# Patient Record
Sex: Female | Born: 1935 | Race: Black or African American | Hispanic: No | Smoking: Former smoker
Health system: Southern US, Community
[De-identification: ages and names within clinical notes are randomized; demographics above are authoritative.]

## PROBLEM LIST (undated history)

## (undated) DIAGNOSIS — N189 Chronic kidney disease, unspecified: Secondary | ICD-10-CM

## (undated) DIAGNOSIS — T6701XA Heatstroke and sunstroke, initial encounter: Secondary | ICD-10-CM

## (undated) DIAGNOSIS — I10 Essential (primary) hypertension: Secondary | ICD-10-CM

## (undated) DIAGNOSIS — F0390 Unspecified dementia without behavioral disturbance: Secondary | ICD-10-CM

## (undated) HISTORY — DX: Heatstroke and sunstroke, initial encounter: T67.01XA

## (undated) HISTORY — DX: Unspecified dementia, unspecified severity, without behavioral disturbance, psychotic disturbance, mood disturbance, and anxiety: F03.90

## (undated) HISTORY — PX: COLONOSCOPY: SHX174

---

## 1962-05-12 HISTORY — PX: ABDOMINAL HYSTERECTOMY: SHX81

## 1999-06-27 ENCOUNTER — Emergency Department (HOSPITAL_COMMUNITY): Admission: EM | Admit: 1999-06-27 | Discharge: 1999-06-27 | Payer: Self-pay | Admitting: Emergency Medicine

## 1999-07-03 ENCOUNTER — Encounter: Admission: RE | Admit: 1999-07-03 | Discharge: 1999-07-03 | Payer: Self-pay | Admitting: Internal Medicine

## 1999-07-10 ENCOUNTER — Ambulatory Visit (HOSPITAL_COMMUNITY): Admission: RE | Admit: 1999-07-10 | Discharge: 1999-07-10 | Payer: Self-pay | Admitting: *Deleted

## 1999-09-24 ENCOUNTER — Encounter: Admission: RE | Admit: 1999-09-24 | Discharge: 1999-09-24 | Payer: Self-pay | Admitting: Hematology and Oncology

## 1999-09-25 ENCOUNTER — Encounter: Payer: Self-pay | Admitting: *Deleted

## 1999-09-25 ENCOUNTER — Ambulatory Visit (HOSPITAL_COMMUNITY): Admission: RE | Admit: 1999-09-25 | Discharge: 1999-09-25 | Payer: Self-pay | Admitting: *Deleted

## 1999-10-08 ENCOUNTER — Encounter: Admission: RE | Admit: 1999-10-08 | Discharge: 1999-10-08 | Payer: Self-pay | Admitting: Internal Medicine

## 2000-02-03 ENCOUNTER — Encounter: Admission: RE | Admit: 2000-02-03 | Discharge: 2000-02-03 | Payer: Self-pay | Admitting: Internal Medicine

## 2000-04-13 ENCOUNTER — Encounter: Admission: RE | Admit: 2000-04-13 | Discharge: 2000-04-13 | Payer: Self-pay | Admitting: Internal Medicine

## 2000-04-27 ENCOUNTER — Encounter: Admission: RE | Admit: 2000-04-27 | Discharge: 2000-04-27 | Payer: Self-pay | Admitting: Internal Medicine

## 2000-05-06 ENCOUNTER — Encounter: Admission: RE | Admit: 2000-05-06 | Discharge: 2000-05-06 | Payer: Self-pay | Admitting: Internal Medicine

## 2000-05-25 ENCOUNTER — Encounter: Admission: RE | Admit: 2000-05-25 | Discharge: 2000-05-25 | Payer: Self-pay | Admitting: Internal Medicine

## 2000-07-23 ENCOUNTER — Encounter: Admission: RE | Admit: 2000-07-23 | Discharge: 2000-07-23 | Payer: Self-pay | Admitting: Hematology and Oncology

## 2000-07-30 ENCOUNTER — Ambulatory Visit (HOSPITAL_COMMUNITY): Admission: RE | Admit: 2000-07-30 | Discharge: 2000-07-30 | Payer: Self-pay

## 2000-09-21 ENCOUNTER — Encounter: Payer: Self-pay | Admitting: Internal Medicine

## 2000-09-21 ENCOUNTER — Encounter: Admission: RE | Admit: 2000-09-21 | Discharge: 2000-09-21 | Payer: Self-pay | Admitting: Internal Medicine

## 2000-09-21 ENCOUNTER — Ambulatory Visit (HOSPITAL_COMMUNITY): Admission: RE | Admit: 2000-09-21 | Discharge: 2000-09-21 | Payer: Self-pay | Admitting: Internal Medicine

## 2000-10-14 ENCOUNTER — Ambulatory Visit (HOSPITAL_COMMUNITY): Admission: RE | Admit: 2000-10-14 | Discharge: 2000-10-14 | Payer: Self-pay | Admitting: *Deleted

## 2000-11-26 ENCOUNTER — Ambulatory Visit (HOSPITAL_COMMUNITY): Admission: RE | Admit: 2000-11-26 | Discharge: 2000-11-26 | Payer: Self-pay | Admitting: Internal Medicine

## 2000-11-26 ENCOUNTER — Encounter: Admission: RE | Admit: 2000-11-26 | Discharge: 2000-11-26 | Payer: Self-pay | Admitting: Obstetrics

## 2000-11-27 ENCOUNTER — Encounter: Admission: RE | Admit: 2000-11-27 | Discharge: 2000-11-27 | Payer: Self-pay | Admitting: Internal Medicine

## 2001-01-21 ENCOUNTER — Encounter: Admission: RE | Admit: 2001-01-21 | Discharge: 2001-01-21 | Payer: Self-pay | Admitting: Internal Medicine

## 2001-01-27 ENCOUNTER — Encounter (INDEPENDENT_AMBULATORY_CARE_PROVIDER_SITE_OTHER): Payer: Self-pay | Admitting: Specialist

## 2001-01-27 ENCOUNTER — Ambulatory Visit (HOSPITAL_COMMUNITY): Admission: RE | Admit: 2001-01-27 | Discharge: 2001-01-27 | Payer: Self-pay | Admitting: *Deleted

## 2001-03-03 ENCOUNTER — Encounter: Admission: RE | Admit: 2001-03-03 | Discharge: 2001-03-03 | Payer: Self-pay

## 2001-04-21 ENCOUNTER — Encounter: Admission: RE | Admit: 2001-04-21 | Discharge: 2001-04-21 | Payer: Self-pay | Admitting: Internal Medicine

## 2001-07-12 ENCOUNTER — Encounter: Admission: RE | Admit: 2001-07-12 | Discharge: 2001-07-12 | Payer: Self-pay | Admitting: Internal Medicine

## 2001-07-15 ENCOUNTER — Ambulatory Visit (HOSPITAL_COMMUNITY): Admission: RE | Admit: 2001-07-15 | Discharge: 2001-07-15 | Payer: Self-pay | Admitting: *Deleted

## 2001-10-20 ENCOUNTER — Encounter: Admission: RE | Admit: 2001-10-20 | Discharge: 2001-10-20 | Payer: Self-pay | Admitting: Internal Medicine

## 2002-01-31 ENCOUNTER — Encounter: Admission: RE | Admit: 2002-01-31 | Discharge: 2002-01-31 | Payer: Self-pay | Admitting: Internal Medicine

## 2002-02-10 ENCOUNTER — Encounter: Admission: RE | Admit: 2002-02-10 | Discharge: 2002-02-10 | Payer: Self-pay | Admitting: Internal Medicine

## 2002-02-16 ENCOUNTER — Ambulatory Visit (HOSPITAL_COMMUNITY): Admission: RE | Admit: 2002-02-16 | Discharge: 2002-02-16 | Payer: Self-pay | Admitting: Internal Medicine

## 2002-03-09 ENCOUNTER — Encounter: Admission: RE | Admit: 2002-03-09 | Discharge: 2002-03-09 | Payer: Self-pay | Admitting: Internal Medicine

## 2002-03-24 ENCOUNTER — Encounter: Admission: RE | Admit: 2002-03-24 | Discharge: 2002-03-24 | Payer: Self-pay | Admitting: Internal Medicine

## 2002-06-13 ENCOUNTER — Encounter: Admission: RE | Admit: 2002-06-13 | Discharge: 2002-06-13 | Payer: Self-pay | Admitting: Internal Medicine

## 2002-07-18 ENCOUNTER — Encounter (INDEPENDENT_AMBULATORY_CARE_PROVIDER_SITE_OTHER): Payer: Self-pay | Admitting: Internal Medicine

## 2002-07-18 ENCOUNTER — Encounter: Admission: RE | Admit: 2002-07-18 | Discharge: 2002-07-18 | Payer: Self-pay | Admitting: Internal Medicine

## 2002-07-27 ENCOUNTER — Encounter: Admission: RE | Admit: 2002-07-27 | Discharge: 2002-07-27 | Payer: Self-pay | Admitting: Internal Medicine

## 2002-10-17 ENCOUNTER — Encounter: Admission: RE | Admit: 2002-10-17 | Discharge: 2002-10-17 | Payer: Self-pay | Admitting: Internal Medicine

## 2002-11-16 ENCOUNTER — Encounter: Payer: Self-pay | Admitting: Emergency Medicine

## 2002-11-16 ENCOUNTER — Emergency Department (HOSPITAL_COMMUNITY): Admission: EM | Admit: 2002-11-16 | Discharge: 2002-11-17 | Payer: Self-pay | Admitting: Emergency Medicine

## 2003-01-23 ENCOUNTER — Encounter: Admission: RE | Admit: 2003-01-23 | Discharge: 2003-01-23 | Payer: Self-pay | Admitting: Internal Medicine

## 2003-04-28 ENCOUNTER — Encounter: Admission: RE | Admit: 2003-04-28 | Discharge: 2003-04-28 | Payer: Self-pay | Admitting: Internal Medicine

## 2003-05-19 ENCOUNTER — Encounter: Admission: RE | Admit: 2003-05-19 | Discharge: 2003-05-19 | Payer: Self-pay | Admitting: Internal Medicine

## 2003-12-29 ENCOUNTER — Encounter: Admission: RE | Admit: 2003-12-29 | Discharge: 2003-12-29 | Payer: Self-pay | Admitting: Internal Medicine

## 2004-01-08 ENCOUNTER — Encounter: Admission: RE | Admit: 2004-01-08 | Discharge: 2004-01-08 | Payer: Self-pay | Admitting: Internal Medicine

## 2004-06-20 ENCOUNTER — Ambulatory Visit: Payer: Self-pay | Admitting: Internal Medicine

## 2004-08-29 ENCOUNTER — Ambulatory Visit: Payer: Self-pay | Admitting: Internal Medicine

## 2004-09-04 ENCOUNTER — Ambulatory Visit (HOSPITAL_COMMUNITY)
Admission: RE | Admit: 2004-09-04 | Discharge: 2004-09-04 | Payer: Self-pay | Admitting: Physical Medicine and Rehabilitation

## 2004-09-04 ENCOUNTER — Ambulatory Visit: Payer: Self-pay | Admitting: Internal Medicine

## 2004-09-05 ENCOUNTER — Ambulatory Visit (HOSPITAL_COMMUNITY): Admission: RE | Admit: 2004-09-05 | Discharge: 2004-09-05 | Payer: Self-pay | Admitting: Internal Medicine

## 2004-09-11 ENCOUNTER — Ambulatory Visit: Payer: Self-pay | Admitting: Internal Medicine

## 2004-09-27 ENCOUNTER — Ambulatory Visit: Payer: Self-pay | Admitting: Internal Medicine

## 2004-11-25 ENCOUNTER — Ambulatory Visit: Payer: Self-pay | Admitting: Internal Medicine

## 2004-12-02 ENCOUNTER — Ambulatory Visit: Payer: Self-pay | Admitting: Internal Medicine

## 2004-12-23 ENCOUNTER — Ambulatory Visit: Payer: Self-pay | Admitting: Internal Medicine

## 2005-01-06 ENCOUNTER — Ambulatory Visit: Payer: Self-pay | Admitting: Internal Medicine

## 2005-02-18 ENCOUNTER — Ambulatory Visit: Payer: Self-pay | Admitting: Internal Medicine

## 2005-02-21 ENCOUNTER — Ambulatory Visit: Payer: Self-pay | Admitting: Internal Medicine

## 2005-05-27 ENCOUNTER — Ambulatory Visit (HOSPITAL_COMMUNITY): Admission: RE | Admit: 2005-05-27 | Discharge: 2005-05-27 | Payer: Self-pay | Admitting: Internal Medicine

## 2005-05-27 ENCOUNTER — Ambulatory Visit: Payer: Self-pay | Admitting: Internal Medicine

## 2005-06-13 ENCOUNTER — Ambulatory Visit: Payer: Self-pay | Admitting: Hospitalist

## 2006-02-27 ENCOUNTER — Encounter (INDEPENDENT_AMBULATORY_CARE_PROVIDER_SITE_OTHER): Payer: Self-pay | Admitting: Internal Medicine

## 2006-02-27 DIAGNOSIS — I868 Varicose veins of other specified sites: Secondary | ICD-10-CM | POA: Insufficient documentation

## 2006-02-27 DIAGNOSIS — Z87898 Personal history of other specified conditions: Secondary | ICD-10-CM | POA: Insufficient documentation

## 2006-02-27 DIAGNOSIS — K219 Gastro-esophageal reflux disease without esophagitis: Secondary | ICD-10-CM | POA: Insufficient documentation

## 2006-02-27 DIAGNOSIS — I10 Essential (primary) hypertension: Secondary | ICD-10-CM | POA: Insufficient documentation

## 2006-02-27 DIAGNOSIS — M1990 Unspecified osteoarthritis, unspecified site: Secondary | ICD-10-CM | POA: Insufficient documentation

## 2006-02-27 DIAGNOSIS — M899 Disorder of bone, unspecified: Secondary | ICD-10-CM | POA: Insufficient documentation

## 2006-02-27 DIAGNOSIS — F411 Generalized anxiety disorder: Secondary | ICD-10-CM | POA: Insufficient documentation

## 2006-02-27 DIAGNOSIS — M949 Disorder of cartilage, unspecified: Secondary | ICD-10-CM

## 2006-02-27 DIAGNOSIS — Z87442 Personal history of urinary calculi: Secondary | ICD-10-CM | POA: Insufficient documentation

## 2006-02-27 DIAGNOSIS — O00109 Unspecified tubal pregnancy without intrauterine pregnancy: Secondary | ICD-10-CM | POA: Insufficient documentation

## 2010-09-27 NOTE — Consult Note (Signed)
NAME:  Robin Meyers, Robin Meyers                        ACCOUNT NO.:  [id number]   MEDICAL RECORD NO.:  [id number]                   PATIENT TYPE:  EMS   LOCATION:  ED                                   FACILITY:  [facility]   PHYSICIAN:  Katy Fitch. Naaman Plummer., M.D.          DATE OF BIRTH:  1928/07/20   DATE OF CONSULTATION:  DATE OF DISCHARGE:                                   CONSULTATION   CHIEF COMPLAINT:  Injury right long and ring fingers due to lawn mower  trauma.   HISTORY:  Robin Meyers is a 77 year old right hand dominant woman who was  brought by her family to the [facility] Emergency Room by private  vehicle following an unfortunate lawn mower injury at home.  While  attempting to clear some material from her lawn mower outlet she  accidentally sustained a lawn mower bite type injury to the right dominant  long and ring fingertips.  She had bleeding, untidy wounds.  She was  examined in the emergency room by Dr. Johny Blamer and noted to have signs of a  possible open fracture of her distal phalanges of the long and ring fingers.  For pain management digital blocks were placed with Marcaine, followed by  routine evaluation and management in the emergency room.   X-rays revealed signs of significant soft tissue trauma to the pulps of the  long and ring fingers as well as open fracture of the distal tufts.   A hand surgery consult was requested.  Due to the need to attend to an  acutely ill individual with an abscess a cognitive consultation with the  assistance of the emergency room physician's assistant was accomplished at  this time.   PAST MEDICAL HISTORY:  1. Robin Meyers's past medical history revealed that she has a history of     possible PENICILLIN allergy.  2. She could not recall her last tetanus immunization.  Her past medical problems include:  1. Hypertension.  2. Anxiety disorder.  3. Elevated cholesterol.  4. She denied a history of diabetes, thyroid disease,  gout or rheumatoid     arthritis.   SOCIAL HISTORY:  Reveals that she is a nonsmoker, does not use alcohol,  denies drug use.   BRIEF REVIEW OF SYSTEMS:  Noncontributory.   PHYSICAL EXAMINATION:  GENERAL:  Revealed an acutely traumatized 77 year old  woman.  She was an alert and oriented  77 year old woman.  VITAL SIGNS:  Revealed a temperature of 98.1 oral, blood pressure 151/82  sitting, heart rate 89, respirations 22.  MUSCULOSKELETAL:  Examination of her right upper extremity revealed untidy  evulsion lacerations of the distal pulps of her right long and ring fingers  with loss of both fat and the normal skin coverage of the distal phalangeal  tuft.  She had stellate lacerations with small skin islands present.  She  had hematoma covering her distal phalangeal fractures.  She did not  have any  significant injury to her nailfold nor did she have injury to her  interphalangeal joints.  Her neurovascular exam was difficult to ascertain  due to her prior digital blocks however, there was no reason to suspect that  she had significant neurologic injury other than the soft tissue loss  distally.   Her x-rays were reviewed and documented her soft tissue injuries and open  fracture predicament.   ASSESSMENT:  She had trauma to her right long and ring fingertips primarily  to the soft tissues of the pulp with open fractures of the distal phalanges.   Emergency room staff was instructed to perform saline irrigation, followed  by Betadine scrub of her fingertips, followed by dressing the fingertips  with a nonadherent dressing with Silvadene, gauze and Coban.   PLAN:  Have her return to the [facility] for followup in 3-5  days.  She was given our phone number and a map to the office and will  return sooner p.r.n. problems.  She will be covered by broad-spectrum  antibiotics such as Keflex.                                                Katy Fitch Naaman Plummer., M.D.     RVS/MEDQ  D:  11/17/2002  T:  11/17/2002  Job:  [id number]

## 2013-12-01 ENCOUNTER — Emergency Department (HOSPITAL_COMMUNITY): Payer: Medicare Other

## 2013-12-01 ENCOUNTER — Inpatient Hospital Stay (HOSPITAL_COMMUNITY)
Admission: EM | Admit: 2013-12-01 | Discharge: 2013-12-05 | DRG: 682 | Disposition: A | Discharge status: Skilled Nursing Facility | Payer: Medicare Other | Attending: Internal Medicine | Admitting: Internal Medicine

## 2013-12-01 ENCOUNTER — Encounter (HOSPITAL_COMMUNITY): Payer: Self-pay | Admitting: Radiology

## 2013-12-01 DIAGNOSIS — M899 Disorder of bone, unspecified: Secondary | ICD-10-CM

## 2013-12-01 DIAGNOSIS — G9340 Encephalopathy, unspecified: Secondary | ICD-10-CM

## 2013-12-01 DIAGNOSIS — E861 Hypovolemia: Secondary | ICD-10-CM | POA: Diagnosis present

## 2013-12-01 DIAGNOSIS — N183 Chronic kidney disease, stage 3 unspecified: Secondary | ICD-10-CM | POA: Diagnosis present

## 2013-12-01 DIAGNOSIS — N170 Acute kidney failure with tubular necrosis: Principal | ICD-10-CM | POA: Diagnosis present

## 2013-12-01 DIAGNOSIS — F0390 Unspecified dementia without behavioral disturbance: Secondary | ICD-10-CM | POA: Diagnosis present

## 2013-12-01 DIAGNOSIS — E870 Hyperosmolality and hypernatremia: Secondary | ICD-10-CM | POA: Diagnosis present

## 2013-12-01 DIAGNOSIS — N189 Chronic kidney disease, unspecified: Secondary | ICD-10-CM

## 2013-12-01 DIAGNOSIS — M1990 Unspecified osteoarthritis, unspecified site: Secondary | ICD-10-CM

## 2013-12-01 DIAGNOSIS — E785 Hyperlipidemia, unspecified: Secondary | ICD-10-CM | POA: Diagnosis present

## 2013-12-01 DIAGNOSIS — O00109 Unspecified tubal pregnancy without intrauterine pregnancy: Secondary | ICD-10-CM

## 2013-12-01 DIAGNOSIS — R509 Fever, unspecified: Secondary | ICD-10-CM | POA: Diagnosis not present

## 2013-12-01 DIAGNOSIS — Z87898 Personal history of other specified conditions: Secondary | ICD-10-CM

## 2013-12-01 DIAGNOSIS — Z87442 Personal history of urinary calculi: Secondary | ICD-10-CM

## 2013-12-01 DIAGNOSIS — R410 Disorientation, unspecified: Secondary | ICD-10-CM

## 2013-12-01 DIAGNOSIS — R319 Hematuria, unspecified: Secondary | ICD-10-CM | POA: Diagnosis not present

## 2013-12-01 DIAGNOSIS — E860 Dehydration: Secondary | ICD-10-CM

## 2013-12-01 DIAGNOSIS — Z87891 Personal history of nicotine dependence: Secondary | ICD-10-CM

## 2013-12-01 DIAGNOSIS — F411 Generalized anxiety disorder: Secondary | ICD-10-CM

## 2013-12-01 DIAGNOSIS — I129 Hypertensive chronic kidney disease with stage 1 through stage 4 chronic kidney disease, or unspecified chronic kidney disease: Secondary | ICD-10-CM | POA: Diagnosis present

## 2013-12-01 DIAGNOSIS — N179 Acute kidney failure, unspecified: Secondary | ICD-10-CM | POA: Diagnosis present

## 2013-12-01 DIAGNOSIS — Z7982 Long term (current) use of aspirin: Secondary | ICD-10-CM

## 2013-12-01 DIAGNOSIS — I10 Essential (primary) hypertension: Secondary | ICD-10-CM | POA: Diagnosis present

## 2013-12-01 DIAGNOSIS — M949 Disorder of cartilage, unspecified: Secondary | ICD-10-CM

## 2013-12-01 DIAGNOSIS — I868 Varicose veins of other specified sites: Secondary | ICD-10-CM

## 2013-12-01 HISTORY — DX: Chronic kidney disease, unspecified: N18.9

## 2013-12-01 HISTORY — DX: Essential (primary) hypertension: I10

## 2013-12-01 LAB — URINALYSIS, ROUTINE W REFLEX MICROSCOPIC
GLUCOSE, UA: NEGATIVE mg/dL
KETONES UR: 15 mg/dL — AB
Nitrite: NEGATIVE
PROTEIN: 100 mg/dL — AB
Specific Gravity, Urine: 1.031 — ABNORMAL HIGH (ref 1.005–1.030)
Urobilinogen, UA: 0.2 mg/dL (ref 0.0–1.0)
pH: 5 (ref 5.0–8.0)

## 2013-12-01 LAB — CK: Total CK: 271 U/L — ABNORMAL HIGH (ref 7–177)

## 2013-12-01 LAB — CBC WITH DIFFERENTIAL/PLATELET
BASOS ABS: 0 10*3/uL (ref 0.0–0.1)
BASOS PCT: 0 % (ref 0–1)
Eosinophils Absolute: 0 10*3/uL (ref 0.0–0.7)
Eosinophils Relative: 0 % (ref 0–5)
HCT: 46.5 % — ABNORMAL HIGH (ref 36.0–46.0)
HEMOGLOBIN: 15 g/dL (ref 12.0–15.0)
Lymphocytes Relative: 22 % (ref 12–46)
Lymphs Abs: 1.9 10*3/uL (ref 0.7–4.0)
MCH: 28.9 pg (ref 26.0–34.0)
MCHC: 32.3 g/dL (ref 30.0–36.0)
MCV: 89.6 fL (ref 78.0–100.0)
MONOS PCT: 4 % (ref 3–12)
Monocytes Absolute: 0.4 10*3/uL (ref 0.1–1.0)
NEUTROS ABS: 6.6 10*3/uL (ref 1.7–7.7)
Neutrophils Relative %: 74 % (ref 43–77)
Platelets: 219 10*3/uL (ref 150–400)
RBC: 5.19 MIL/uL — ABNORMAL HIGH (ref 3.87–5.11)
RDW: 16 % — ABNORMAL HIGH (ref 11.5–15.5)
WBC: 8.9 10*3/uL (ref 4.0–10.5)

## 2013-12-01 LAB — COMPREHENSIVE METABOLIC PANEL
ALBUMIN: 3.7 g/dL (ref 3.5–5.2)
ALK PHOS: 53 U/L (ref 39–117)
ALT: 21 U/L (ref 0–35)
AST: 41 U/L — AB (ref 0–37)
Anion gap: 16 — ABNORMAL HIGH (ref 5–15)
BILIRUBIN TOTAL: 0.7 mg/dL (ref 0.3–1.2)
BUN: 23 mg/dL (ref 6–23)
CHLORIDE: 109 meq/L (ref 96–112)
CO2: 22 mEq/L (ref 19–32)
Calcium: 9.2 mg/dL (ref 8.4–10.5)
Creatinine, Ser: 1.44 mg/dL — ABNORMAL HIGH (ref 0.50–1.10)
GFR calc Af Amer: 38 mL/min — ABNORMAL LOW (ref >90)
GFR calc non Af Amer: 33 mL/min — ABNORMAL LOW (ref >90)
Glucose, Bld: 96 mg/dL (ref 70–99)
Potassium: 3.7 mEq/L (ref 3.7–5.3)
Sodium: 147 mEq/L (ref 137–147)
Total Protein: 7.8 g/dL (ref 6.0–8.3)

## 2013-12-01 LAB — I-STAT CG4 LACTIC ACID, ED: LACTIC ACID, VENOUS: 1.41 mmol/L (ref 0.5–2.2)

## 2013-12-01 LAB — URINE MICROSCOPIC-ADD ON

## 2013-12-01 LAB — I-STAT TROPONIN, ED: Troponin i, poc: 0.06 ng/mL (ref 0.00–0.08)

## 2013-12-01 LAB — PRO B NATRIURETIC PEPTIDE: PRO B NATRI PEPTIDE: 1121 pg/mL — AB (ref 0–450)

## 2013-12-01 MED ORDER — SODIUM CHLORIDE 0.9 % IJ SOLN
3.0000 mL | Freq: Two times a day (BID) | INTRAMUSCULAR | Status: DC
  Administered 2013-12-02 – 2013-12-05 (×4): 3 mL via INTRAVENOUS

## 2013-12-01 MED ORDER — SODIUM CHLORIDE 0.9 % IV SOLN
INTRAVENOUS | Status: DC
  Administered 2013-12-01 – 2013-12-02 (×3): via INTRAVENOUS

## 2013-12-01 MED ORDER — SODIUM CHLORIDE 0.9 % IV BOLUS (SEPSIS)
250.0000 mL | Freq: Once | INTRAVENOUS | Status: AC
  Administered 2013-12-01: 250 mL via INTRAVENOUS

## 2013-12-01 MED ORDER — ACETAMINOPHEN 650 MG RE SUPP
650.0000 mg | Freq: Once | RECTAL | Status: AC
Start: 2013-12-01 — End: 2013-12-01
  Administered 2013-12-01: 650 mg via RECTAL
  Filled 2013-12-01: qty 1

## 2013-12-01 MED ORDER — SODIUM CHLORIDE 0.9 % IV SOLN
INTRAVENOUS | Status: AC
  Administered 2013-12-01: via INTRAVENOUS

## 2013-12-01 MED ORDER — ASPIRIN 325 MG PO TABS
325.0000 mg | ORAL_TABLET | Freq: Every day | ORAL | Status: DC | PRN
  Filled 2013-12-01: qty 1

## 2013-12-01 MED ORDER — ADULT MULTIVITAMIN W/MINERALS CH
1.0000 | ORAL_TABLET | Freq: Every day | ORAL | Status: DC
  Administered 2013-12-04: 1 via ORAL
  Filled 2013-12-01 (×4): qty 1

## 2013-12-01 MED ORDER — HEPARIN SODIUM (PORCINE) 5000 UNIT/ML IJ SOLN
5000.0000 [IU] | Freq: Three times a day (TID) | INTRAMUSCULAR | Status: DC
  Administered 2013-12-01 – 2013-12-05 (×10): 5000 [IU] via SUBCUTANEOUS
  Filled 2013-12-01 (×13): qty 1

## 2013-12-01 MED ORDER — ACETAMINOPHEN 325 MG PO TABS: 325.0000 mg | ORAL_TABLET | Freq: Four times a day (QID) | ORAL | Status: DC | PRN

## 2013-12-01 NOTE — H&P (Signed)
[facility] History and Physical  Robin Meyers YNW:[id number] DOB: 1928/11/24 DOA: 12/01/2013  Referring physician: EDP PCP: No PCP Per Patient   Chief Complaint: Hyperthermia   HPI: Robin Meyers is a 80 y.o. female who was not heard from by family for a few days and they couldn't reach her.  They called 911 because they couldn't get into her house when they went to check on her today.  Upon arrival of EMS / Fire, they found patient on couch, incontinent of urine and very warm to touch.  Temp on scene 103 (103.9 in ED).  Neighbor states that last time they saw patient was last night.  Her house was noted to be extremely hot and her [facility] was not on (normally is).  After arrival to the ED, rehydration with IVF, she has improved dramatically and temperature is down to 99.  Her initial confusion and combativeness has improved somewhat and now she is only slightly confused.  She is now able to tell me, that her [facility] window unit, has not been working well (or much at all) recently, and there was even a discussion with a family member about replacing it in the past couple of days.  Review of Systems: Systems reviewed.  As above, otherwise negative  History reviewed. No pertinent past medical history. No past surgical history on file. Social History:  has no tobacco, alcohol, and drug history on file.  No Known Allergies  No family history on file.   Prior to Admission medications   Medication Sig Start Date End Date Taking? Authorizing Provider  acetaminophen (TYLENOL) 325 MG tablet Take 325-650 mg by mouth every 6 (six) hours as needed for mild pain.   Yes Historical Provider, MD  aspirin 325 MG tablet Take 325 mg by mouth daily as needed for mild pain.   Yes Historical Provider, MD  ibuprofen (ADVIL,MOTRIN) 100 MG tablet Take 400 mg by mouth every 6 (six) hours as needed for fever.   Yes Historical Provider, MD  Multiple Vitamin (MULTIVITAMIN WITH MINERALS) TABS tablet Take 1 tablet  by mouth daily.   Yes Historical Provider, MD  naproxen sodium (ANAPROX) 220 MG tablet Take 220 mg by mouth 2 (two) times daily as needed (pain).   Yes Historical Provider, MD   Physical Exam: Filed Vitals:   12/01/13 2133  BP:   Pulse:   Temp: 99 F (37.2 C)  Resp:     BP 152/67  Pulse 71  Temp(Src) 99 F (37.2 C) (Rectal)  Resp 29  Ht 5\' 6"  (1.676 m)  Wt 58.968 kg (130 lb)  BMI 20.99 kg/m2  SpO2 100%  General Appearance:    Alert, mildly confused, no distress, appears stated age  Head:    Normocephalic, atraumatic  Eyes:    PERRL, EOMI, sclera non-icteric        Nose:   Nares without drainage or epistaxis. Mucosa, turbinates normal  Throat:   Moist mucous membranes. Oropharynx without erythema or exudate.  Neck:   Supple. No carotid bruits.  No thyromegaly.  No lymphadenopathy.   Back:     No CVA tenderness, no spinal tenderness  Lungs:     Clear to auscultation bilaterally, without wheezes, rhonchi or rales  Chest wall:    No tenderness to palpitation  Heart:    Regular rate and rhythm without murmurs, gallops, rubs  Abdomen:     Soft, non-tender, nondistended, normal bowel sounds, no organomegaly  Genitalia:    deferred  Rectal:  deferred  Extremities:   No clubbing, cyanosis or edema.  Pulses:   2+ and symmetric all extremities  Skin:   Skin color, texture, turgor normal, no rashes or lesions  Lymph nodes:   Cervical, supraclavicular, and axillary nodes normal  Neurologic:   CNII-XII intact. Normal strength, sensation and reflexes      throughout    Labs on Admission:  Basic Metabolic Panel:  Recent Labs Lab 12/01/13 1936  NA 147  K 3.7  CL 109  CO2 22  GLUCOSE 96  BUN 23  CREATININE 1.44*  CALCIUM 9.2   Liver Function Tests:  Recent Labs Lab 12/01/13 1936  AST 41*  ALT 21  ALKPHOS 53  BILITOT 0.7  PROT 7.8  ALBUMIN 3.7   No results found for this basename: LIPASE, AMYLASE,  in the last 168 hours No results found for this basename:  AMMONIA,  in the last 168 hours CBC:  Recent Labs Lab 12/01/13 1936  WBC 8.9  NEUTROABS 6.6  HGB 15.0  HCT 46.5*  MCV 89.6  PLT 219   Cardiac Enzymes:  Recent Labs Lab 12/01/13 1936  CKTOTAL 271*    BNP (last 3 results)  Recent Labs  12/01/13 1936  PROBNP 1121.0*   CBG: No results found for this basename: GLUCAP,  in the last 168 hours  Radiological Exams on Admission: Ct Head Wo Contrast  12/01/2013   CLINICAL DATA:  Altered mental status.  EXAM: CT HEAD WITHOUT CONTRAST  TECHNIQUE: Contiguous axial images were obtained from the base of the skull through the vertex without intravenous contrast.  COMPARISON:  None.  FINDINGS: There is marked cortical atrophy. Chronic microvascular ischemic change is identified. No evidence of acute abnormality including infarction, hemorrhage, mass lesion, mass effect, midline shift or abnormal extra-axial fluid collection is identified. No hydrocephalus or pneumocephalus. Calvarium intact.  IMPRESSION: No acute finding.  Marked cortical atrophy.   Electronically Signed   By: Drusilla Kanner M.D.   On: 12/01/2013 20:49   Dg Chest Port 1 View  12/01/2013   CLINICAL DATA:  Fever and altered mental status; suspect sepsis  EXAM: PORTABLE CHEST - 1 VIEW  COMPARISON:  None.  FINDINGS: The lungs are adequately inflated. There is no alveolar infiltrate. Minimal linear density lateral to the left hilar region is present. The pulmonary vascularity is not engorged. There is minimal soft tissue fullness in the upper right paratracheal region likely reflecting vascular structures. There is mild tortuosity of the descending thoracic aorta. There is no pleural effusion. The bony thorax is unremarkable.  IMPRESSION: There is no pneumonia. There may be minimal subsegmental atelectasis peripherally in the left mid lung.   Electronically Signed   By: David  [place]   On: 12/01/2013 20:03    EKG: Independently reviewed.  Assessment/Plan Principal Problem:    Hyperthermia Active Problems:   AKI (acute kidney injury)   1. Hyperthermia - due to environmental exposure (home with no AC), patients temperature has improved now that she is in a cooler environment (ED), and with hydration. 2. Dehydration - patient on IV NS for rehydration. 3. AKI - patient with elevated creatinine and findings in urine suggestive of ATN.  Hydrating, repeat BMP in AM, and monitor intake and output.  Holding her home NSAIDS.    Code Status: Full  Family Communication: No family in room Disposition Plan: Admit to inpatient   Time spent: 70 min  Levita Monical M. [facility] Pager [phone]  If 7AM-7PM, please contact the day  team taking care of the patient [email or web address] Password [email or web address] 12/01/2013, 11:22 PM

## 2013-12-01 NOTE — ED Notes (Signed)
Per EMS - pt's family had not heard from pt in a few days, then called 911 because they couldn't get in. Upon arrival pt was found on couch, incontinent of urine and very warm to touch. Fire took temp and got 103. Pt denies pain. The house was very hot, [facility] Pt was very combative initially then started to cool the pt, once she started to cool down the pt became more cooperative but still disoriented. Baseline is normally alert and oriented X 4, per family. Neighbor sts she saw the pt last night. EMS started an 9718 G in left forearm and administered 100 ml of NS. BP 200/100, HR ST 100-110, CBG 102. Temp 101. 98% on room air. Nad, skin warm and dry, resp e/u.

## 2013-12-01 NOTE — ED Notes (Signed)
Called pharmacy to send cool fluids.

## 2013-12-02 ENCOUNTER — Encounter (HOSPITAL_COMMUNITY): Payer: Self-pay | Admitting: *Deleted

## 2013-12-02 DIAGNOSIS — N179 Acute kidney failure, unspecified: Secondary | ICD-10-CM

## 2013-12-02 DIAGNOSIS — G9340 Encephalopathy, unspecified: Secondary | ICD-10-CM

## 2013-12-02 DIAGNOSIS — R509 Fever, unspecified: Secondary | ICD-10-CM

## 2013-12-02 DIAGNOSIS — N189 Chronic kidney disease, unspecified: Secondary | ICD-10-CM

## 2013-12-02 LAB — URINE CULTURE
CULTURE: NO GROWTH
Colony Count: NO GROWTH

## 2013-12-02 LAB — BASIC METABOLIC PANEL
ANION GAP: 12 (ref 5–15)
BUN: 25 mg/dL — ABNORMAL HIGH (ref 6–23)
CO2: 23 mEq/L (ref 19–32)
Calcium: 8.2 mg/dL — ABNORMAL LOW (ref 8.4–10.5)
Chloride: 113 mEq/L — ABNORMAL HIGH (ref 96–112)
Creatinine, Ser: 1.24 mg/dL — ABNORMAL HIGH (ref 0.50–1.10)
GFR calc non Af Amer: 39 mL/min — ABNORMAL LOW (ref >90)
GFR, EST AFRICAN AMERICAN: 46 mL/min — AB (ref >90)
Glucose, Bld: 100 mg/dL — ABNORMAL HIGH (ref 70–99)
Potassium: 4.2 mEq/L (ref 3.7–5.3)
Sodium: 148 mEq/L — ABNORMAL HIGH (ref 137–147)

## 2013-12-02 LAB — CBC
HCT: 40.4 % (ref 36.0–46.0)
Hemoglobin: 12.8 g/dL (ref 12.0–15.0)
MCH: 28.7 pg (ref 26.0–34.0)
MCHC: 31.7 g/dL (ref 30.0–36.0)
MCV: 90.6 fL (ref 78.0–100.0)
PLATELETS: 193 10*3/uL (ref 150–400)
RBC: 4.46 MIL/uL (ref 3.87–5.11)
RDW: 16.3 % — AB (ref 11.5–15.5)
WBC: 10.1 10*3/uL (ref 4.0–10.5)

## 2013-12-02 MED ORDER — SODIUM CHLORIDE 0.45 % IV SOLN
INTRAVENOUS | Status: DC
  Administered 2013-12-02 – 2013-12-04 (×3): via INTRAVENOUS
  Filled 2013-12-02 (×5): qty 1000

## 2013-12-02 MED ORDER — AMLODIPINE BESYLATE 5 MG PO TABS
5.0000 mg | ORAL_TABLET | Freq: Every day | ORAL | Status: DC
  Administered 2013-12-02 – 2013-12-04 (×3): 5 mg via ORAL
  Filled 2013-12-02 (×4): qty 1

## 2013-12-02 NOTE — Progress Notes (Signed)
Admitted 80 yr old pt from ed.oriented x1. Confused accompanied by daugther . Per report  from ed pt came in with feces and urine all over pt.. Social worker to be notified. Oriented to call bell and room with daugther at bedside. VS taken and recorded. Tele on. SR up x 3. Observed pt closely.

## 2013-12-02 NOTE — Progress Notes (Signed)
PROGRESS NOTE  Robin Meyers WNU:[id number] DOB: 03/19/1929 DOA: 12/01/2013 PCP: No PCP Per Patient  Interim history 80 year old female with a history of hypertension, CKD stage III, hyperlipidemia presents to the hospital with fever and confusion. On the one from her family had heard from the patient for 3 days, her daughter went to check on her, but could not get into the house. EMS was activated and broken to the hospital from the patient lying on the couch confused lying in her urine. In emergency department, the patient had temperature 103.11F. Her fever improved with intravenous hydration as well as her mentation. According to the patient's daughter, the patient has had a cognitive decline for over one year with increased paranoia. There has not been any reports of recent vomiting, diarrhea, hematuria. There've been no reports of recent chest discomfort, shortness breath, syncope. However, the patient takes numerous NSAIDs for various body aches. Assessment/Plan: Acute encephalopathy -Multifactorial including infectious process, renal failure, electrolyte disturbance, as well as underlying dementia -Patient's daughter states that patient has baseline confusion and paranoia -Suspect the patient may have dementia at baseline -Urinalysis negative for pyuria -Follow blood cultures -CPK 271 -Check TSH, ammonia, RBC folate, RPR, serum B12, HIV -CT brain negative for acute changes Acute on chronic renal failure (CKD stage III) -Due hypovolemia -IVF -Avoid nephrotoxic agents Hypernatremia -Change to hypotonic fluid -due to free water deficit Hypertension -Start amlodipine Deconditioning -PT evaluation Fever -Resolved with intravenous hydration -Remain off antibiotics as the patient remained hemodynamically stable and fever has not recurred -Follow culture  Family Communication:   Daughter updated at beside Disposition Plan:   Home when medically  stable      Procedures/Studies: Ct Head Wo Contrast  12/01/2013   CLINICAL DATA:  Altered mental status.  EXAM: CT HEAD WITHOUT CONTRAST  TECHNIQUE: Contiguous axial images were obtained from the base of the skull through the vertex without intravenous contrast.  COMPARISON:  None.  FINDINGS: There is marked cortical atrophy. Chronic microvascular ischemic change is identified. No evidence of acute abnormality including infarction, hemorrhage, mass lesion, mass effect, midline shift or abnormal extra-axial fluid collection is identified. No hydrocephalus or pneumocephalus. Calvarium intact.  IMPRESSION: No acute finding.  Marked cortical atrophy.   Electronically Signed   By: Drusilla Kannerhomas  Dalessio M.D.   On: 12/01/2013 20:49   Dg Chest Port 1 View  12/01/2013   CLINICAL DATA:  Fever and altered mental status; suspect sepsis  EXAM: PORTABLE CHEST - 1 VIEW  COMPARISON:  None.  FINDINGS: The lungs are adequately inflated. There is no alveolar infiltrate. Minimal linear density lateral to the left hilar region is present. The pulmonary vascularity is not engorged. There is minimal soft tissue fullness in the upper right paratracheal region likely reflecting vascular structures. There is mild tortuosity of the descending thoracic aorta. There is no pleural effusion. The bony thorax is unremarkable.  IMPRESSION: There is no pneumonia. There may be minimal subsegmental atelectasis peripherally in the left mid lung.   Electronically Signed   By: Mylia Pondexter  [place]   On: 12/01/2013 20:03         Subjective: Patient is pleasantly confused but denies any fevers, chills, chest pain, shortness breath, abdominal pain, nausea, vomiting, diarrhea.  Objective: Filed Vitals:   12/01/13 2130 12/01/13 2133 12/02/13 0008 12/02/13 1446  BP: 152/67  156/70 192/65  Pulse: 71  64 65  Temp:  99 F (37.2 C) 97.9 F (36.6 C) 97.4  F (36.3 C)  TempSrc:    Oral  Resp: 29  18 20   Height:      Weight:      SpO2: 100%  98%  99%    Intake/Output Summary (Last 24 hours) at 12/02/13 1839 Last data filed at 12/02/13 1421  Gross per 24 hour  Intake   1200 ml  Output    600 ml  Net    600 ml   Weight change:  Exam:   General:  Pt is alert, follows commands appropriately, not in acute distress  HEENT: No icterus, No thrush, [place]/AT  Cardiovascular: RRR, S1/S2, no rubs, no gallops  Respiratory: CTA bilaterally, no wheezing, no crackles, no rhonchi  Abdomen: Soft/+BS, non tender, non distended, no guarding  Extremities: No edema, No lymphangitis, No petechiae, No rashes, no synovitis  Data Reviewed: Basic Metabolic Panel:  Recent Labs Lab 12/01/13 1936 12/02/13 0335  NA 147 148*  K 3.7 4.2  CL 109 113*  CO2 22 23  GLUCOSE 96 100*  BUN 23 25*  CREATININE 1.44* 1.24*  CALCIUM 9.2 8.2*   Liver Function Tests:  Recent Labs Lab 12/01/13 1936  AST 41*  ALT 21  ALKPHOS 53  BILITOT 0.7  PROT 7.8  ALBUMIN 3.7   No results found for this basename: LIPASE, AMYLASE,  in the last 168 hours No results found for this basename: AMMONIA,  in the last 168 hours CBC:  Recent Labs Lab 12/01/13 1936 12/02/13 0335  WBC 8.9 10.1  NEUTROABS 6.6  --   HGB 15.0 12.8  HCT 46.5* 40.4  MCV 89.6 90.6  PLT 219 193   Cardiac Enzymes:  Recent Labs Lab 12/01/13 1936  CKTOTAL 271*   BNP: No components found with this basename: POCBNP,  CBG: No results found for this basename: GLUCAP,  in the last 168 hours  No results found for this or any previous visit (from the past 240 hour(s)).   Scheduled Meds: . heparin  5,000 Units Subcutaneous 3 times per day  . multivitamin with minerals  1 tablet Oral Daily  . sodium chloride  3 mL Intravenous Q12H   Continuous Infusions: . sodium chloride 125 mL/hr at 12/02/13 1336     Justene Jensen, DO  [facility] Pager [phone]  If 7PM-7AM, please contact night-coverage [email or web address] Password [facility] 12/02/2013, 6:39 PM   LOS: 1 day

## 2013-12-02 NOTE — ED Provider Notes (Signed)
CSN: [id number]     Arrival date & time 12/01/13  1858 History   First MD Initiated Contact with Patient 12/01/13 1859     Chief Complaint  Patient presents with  . Fever  . Altered Mental Status     (Consider location/radiation/quality/duration/timing/severity/associated sxs/prior Treatment) Patient is a 80 y.o. female presenting with fever and altered mental status. The history is provided by the EMS personnel. The history is limited by the condition of the patient.  Fever Altered Mental Status Associated symptoms: fever    patient very confused upon arrival. History not reliable. Level V caveat applies.  Patient's family noted that they could not get in contact with her. They called 911 had to break into the house. The patient was found on the couch. Incontinent of urine and very warm to touch. Patient's temperature there was 103. Patient's house was very hot. Air conditioner was not functioning. Patient was combative initially and then started to cool once outside and got IV fluids and patient started to become a little bit more cooperative. There was still disoriented. Patient's normally is alert and oriented x4 according to family. They were stated she last saw the patient last night.  Past Medical History  Diagnosis Date  . Hypertension   . Chronic kidney disease     kidney stones   Past Surgical History  Procedure Laterality Date  . Abdominal hysterectomy      partial   No family history on file. History  Substance Use Topics  . Smoking status: Former Smoker    Types: Cigarettes    Quit date: 12/02/1968  . Smokeless tobacco: Not on file  . Alcohol Use: No   OB History   Grav Para Term Preterm Abortions TAB SAB Ect Mult Living                 Review of Systems  Unable to perform ROS Constitutional: Positive for fever.   level V caveat applies the review of systems.    Allergies  Review of patient's allergies indicates no known allergies.  Home Medications    Prior to Admission medications   Medication Sig Start Date End Date Taking? Authorizing Provider  acetaminophen (TYLENOL) 325 MG tablet Take 325-650 mg by mouth every 6 (six) hours as needed for mild pain.   Yes Historical Provider, MD  aspirin 325 MG tablet Take 325 mg by mouth daily as needed for mild pain.   Yes Historical Provider, MD  ibuprofen (ADVIL,MOTRIN) 100 MG tablet Take 400 mg by mouth every 6 (six) hours as needed for fever.   Yes Historical Provider, MD  Multiple Vitamin (MULTIVITAMIN WITH MINERALS) TABS tablet Take 1 tablet by mouth daily.   Yes Historical Provider, MD  naproxen sodium (ANAPROX) 220 MG tablet Take 220 mg by mouth 2 (two) times daily as needed (pain).   Yes Historical Provider, MD   BP 156/70  Pulse 64  Temp(Src) 97.9 F (36.6 C) (Rectal)  Resp 18  Ht 5\' 6"  (1.676 m)  Wt 130 lb (58.968 kg)  BMI 20.99 kg/m2  SpO2 98% Physical Exam  Nursing note and vitals reviewed. Constitutional: She appears well-developed and well-nourished. She appears distressed.  HENT:  Head: Normocephalic and atraumatic.  Eyes: Conjunctivae and EOM are normal. Pupils are equal, round, and reactive to light.  Neck: Neck supple.  Cardiovascular: Regular rhythm and normal heart sounds.   Tachycardic  Pulmonary/Chest: Effort normal and breath sounds normal. No respiratory distress.  Abdominal: Soft. Bowel sounds are  normal. There is no tenderness.  Musculoskeletal: She exhibits no edema.  Neurological:  Upon arrival no significant responsiveness. Patient would not follow commands. Patient was very confused.  Skin: Skin is warm.  Patient very hot.    ED Course  Procedures (including critical care time) Labs Review Labs Reviewed  CBC WITH DIFFERENTIAL - Abnormal; Notable for the following:    RBC 5.19 (*)    HCT 46.5 (*)    RDW 16.0 (*)    All other components within normal limits  COMPREHENSIVE METABOLIC PANEL - Abnormal; Notable for the following:    Creatinine, Ser  1.44 (*)    AST 41 (*)    GFR calc non Af Amer 33 (*)    GFR calc Af Amer 38 (*)    Anion gap 16 (*)    All other components within normal limits  URINALYSIS, ROUTINE W REFLEX MICROSCOPIC - Abnormal; Notable for the following:    Color, Urine AMBER (*)    APPearance CLOUDY (*)    Specific Gravity, Urine 1.031 (*)    Hgb urine dipstick LARGE (*)    Bilirubin Urine SMALL (*)    Ketones, ur 15 (*)    Protein, ur 100 (*)    Leukocytes, UA SMALL (*)    All other components within normal limits  PRO B NATRIURETIC PEPTIDE - Abnormal; Notable for the following:    Pro B Natriuretic peptide (BNP) 1121.0 (*)    All other components within normal limits  CK - Abnormal; Notable for the following:    Total CK 271 (*)    All other components within normal limits  URINE MICROSCOPIC-ADD ON - Abnormal; Notable for the following:    Squamous Epithelial / LPF MANY (*)    Bacteria, UA FEW (*)    Casts GRANULAR CAST (*)    All other components within normal limits  CULTURE, BLOOD (ROUTINE X 2)  CULTURE, BLOOD (ROUTINE X 2)  URINE CULTURE  CBC  BASIC METABOLIC PANEL  I-STAT CG4 LACTIC ACID, ED  I-STAT TROPOININ, ED   Results for orders placed during the hospital encounter of 12/01/13  CBC WITH DIFFERENTIAL      Result Value Ref Range   WBC 8.9  4.0 - 10.5 K/uL   RBC 5.19 (*) 3.87 - 5.11 MIL/uL   Hemoglobin 15.0  12.0 - 15.0 g/dL   HCT 08.646.5 (*) 57.836.0 - 46.946.0 %   MCV 89.6  78.0 - 100.0 fL   MCH 28.9  26.0 - 34.0 pg   MCHC 32.3  30.0 - 36.0 g/dL   RDW 62.916.0 (*) 52.811.5 - 41.315.5 %   Platelets 219  150 - 400 K/uL   Neutrophils Relative % 74  43 - 77 %   Neutro Abs 6.6  1.7 - 7.7 K/uL   Lymphocytes Relative 22  12 - 46 %   Lymphs Abs 1.9  0.7 - 4.0 K/uL   Monocytes Relative 4  3 - 12 %   Monocytes Absolute 0.4  0.1 - 1.0 K/uL   Eosinophils Relative 0  0 - 5 %   Eosinophils Absolute 0.0  0.0 - 0.7 K/uL   Basophils Relative 0  0 - 1 %   Basophils Absolute 0.0  0.0 - 0.1 K/uL  COMPREHENSIVE  METABOLIC PANEL      Result Value Ref Range   Sodium 147  137 - 147 mEq/L   Potassium 3.7  3.7 - 5.3 mEq/L   Chloride 109  96 - 112  mEq/L   CO2 22  19 - 32 mEq/L   Glucose, Bld 96  70 - 99 mg/dL   BUN 23  6 - 23 mg/dL   Creatinine, Ser 9.14 (*) 0.50 - 1.10 mg/dL   Calcium 9.2  8.4 - 78.2 mg/dL   Total Protein 7.8  6.0 - 8.3 g/dL   Albumin 3.7  3.5 - 5.2 g/dL   AST 41 (*) 0 - 37 U/L   ALT 21  0 - 35 U/L   Alkaline Phosphatase 53  39 - 117 U/L   Total Bilirubin 0.7  0.3 - 1.2 mg/dL   GFR calc non Af Amer 33 (*) >90 mL/min   GFR calc Af Amer 38 (*) >90 mL/min   Anion gap 16 (*) 5 - 15  URINALYSIS, ROUTINE W REFLEX MICROSCOPIC      Result Value Ref Range   Color, Urine AMBER (*) YELLOW   APPearance CLOUDY (*) CLEAR   Specific Gravity, Urine 1.031 (*) 1.005 - 1.030   pH 5.0  5.0 - 8.0   Glucose, UA NEGATIVE  NEGATIVE mg/dL   Hgb urine dipstick LARGE (*) NEGATIVE   Bilirubin Urine SMALL (*) NEGATIVE   Ketones, ur 15 (*) NEGATIVE mg/dL   Protein, ur 956 (*) NEGATIVE mg/dL   Urobilinogen, UA 0.2  0.0 - 1.0 mg/dL   Nitrite NEGATIVE  NEGATIVE   Leukocytes, UA SMALL (*) NEGATIVE  PRO B NATRIURETIC PEPTIDE      Result Value Ref Range   Pro B Natriuretic peptide (BNP) 1121.0 (*) 0 - 450 pg/mL  CK      Result Value Ref Range   Total CK 271 (*) 7 - 177 U/L  URINE MICROSCOPIC-ADD ON      Result Value Ref Range   Squamous Epithelial / LPF MANY (*) RARE   WBC, UA 3-6  <3 WBC/hpf   RBC / HPF 7-10  <3 RBC/hpf   Bacteria, UA FEW (*) RARE   Casts GRANULAR CAST (*) NEGATIVE   Urine-Other AMORPHOUS URATES/PHOSPHATES    I-STAT CG4 LACTIC ACID, ED      Result Value Ref Range   Lactic Acid, Venous 1.41  0.5 - 2.2 mmol/L  I-STAT TROPOININ, ED      Result Value Ref Range   Troponin i, poc 0.06  0.00 - 0.08 ng/mL   Comment 3              Imaging Review Ct Head Wo Contrast  12/01/2013   CLINICAL DATA:  Altered mental status.  EXAM: CT HEAD WITHOUT CONTRAST  TECHNIQUE: Contiguous axial  images were obtained from the base of the skull through the vertex without intravenous contrast.  COMPARISON:  None.  FINDINGS: There is marked cortical atrophy. Chronic microvascular ischemic change is identified. No evidence of acute abnormality including infarction, hemorrhage, mass lesion, mass effect, midline shift or abnormal extra-axial fluid collection is identified. No hydrocephalus or pneumocephalus. Calvarium intact.  IMPRESSION: No acute finding.  Marked cortical atrophy.   Electronically Signed   By: Drusilla Kanner M.D.   On: 12/01/2013 20:49   Dg Chest Port 1 View  12/01/2013   CLINICAL DATA:  Fever and altered mental status; suspect sepsis  EXAM: PORTABLE CHEST - 1 VIEW  COMPARISON:  None.  FINDINGS: The lungs are adequately inflated. There is no alveolar infiltrate. Minimal linear density lateral to the left hilar region is present. The pulmonary vascularity is not engorged. There is minimal soft tissue fullness in the upper right  paratracheal region likely reflecting vascular structures. There is mild tortuosity of the descending thoracic aorta. There is no pleural effusion. The bony thorax is unremarkable.  IMPRESSION: There is no pneumonia. There may be minimal subsegmental atelectasis peripherally in the left mid lung.   Electronically Signed   By: David  [place]   On: 12/01/2013 20:03     EKG Interpretation   Date/Time:  Thursday December 01 2013 19:33:42 EDT Ventricular Rate:  89 PR Interval:  137 QRS Duration: 141 QT Interval:  392 QTC Calculation: 477 R Axis:   -56 Text Interpretation:  Sinus rhythm RBBB and LAFB Probable left ventricular  hypertrophy New since previous tracing Confirmed by Aron Needles  MD, Sheriden Archibeque  [phone]) on 12/01/2013 7:37:48 PM      CRITICAL CARE Performed by: Vanetta Mulders Total critical care time: 30 Critical care time was exclusive of separately billable procedures and treating other patients. Critical care was necessary to treat or prevent imminent  or life-threatening deterioration. Critical care was time spent personally by me on the following activities: development of treatment plan with patient and/or surrogate as well as nursing, discussions with consultants, evaluation of patient's response to treatment, examination of patient, obtaining history from patient or surrogate, ordering and performing treatments and interventions, ordering and review of laboratory studies, ordering and review of radiographic studies, pulse oximetry and re-evaluation of patient's condition.    MDM   Final diagnoses:  Hyperthermia  Dehydration    Patient upon arrival had markedly elevated temperature of 103.9. Wasn't clear whether this was a septic type picture although she was not significantly tachycardic she had some tachycardia. Blood pressure was never hypotensive. With this was all hyperthermia. Patient received Tylenol got septic workup. Blood cultures done urine culture done. Patient received cold fluids cooling blanket was ordered but never got here and time. With a cooling fluids patient's temperature improved significantly. Patient likely as it was not markedly elevated. Patient's labs without evidence of any obvious infection. Patient's CPK was not elevated. Suspect that this was probably hyperthermia as her main cause. Patient's head CT was also negative. Chest x-ray was negative for any acute pulmonary findings. Patient's mentation improved significantly as the temperature came down. Patient will be admitted by hospitalist team and monitored overnight and CPKs will be checked. Urine did not have a significant amount of heme in the urine.    Vanetta Mulders, MD 12/02/13 [phone]

## 2013-12-02 NOTE — Progress Notes (Signed)
Nutrition Brief Note  Patient identified on the Malnutrition Screening Tool (MST) Report  Wt Readings from Last 15 Encounters:  12/01/13 130 lb (58.968 kg)    Body mass index is 20.99 kg/(m^2). Patient meets criteria for Normal Weight based on current BMI. Pt denies any weight loss but, she is unsure how much she usually weighs. She reports having a good appetite and eating well PTA. She appears well-nourished.   Current diet order is Regular, patient is consuming approximately 100% of meals at this time. Labs and medications reviewed.   No nutrition interventions warranted at this time. If nutrition issues arise, please consult RD.   Ian Malkineanne Barnett RD, LDN Inpatient Clinical Dietitian Pager: [phone] After Hours Pager: [phone]

## 2013-12-02 NOTE — Progress Notes (Signed)
Pt in bed resting quietly and watching TV.  A&0x1.  Denies/pain discomfort.  Call bell at bedside.  Will continue to monitor.  Amanda PeaNellie Shelbylynn Walczyk, Charity fundraiserN.

## 2013-12-02 NOTE — Care Management Note (Signed)
    Page 1 of 1   12/02/2013     2:34:54 PM CARE MANAGEMENT NOTE 12/02/2013  Patient:  Robin [facility],Robin Meyers   Account Number:  [id number]  Date Initiated:  12/02/2013  Documentation initiated by:  [facility],Wyonia Fontanella  Subjective/Objective Assessment:   80 yo female Hyperthermia - due to environmental exposure (home with no AC).  Dehydration - patient on IV NS for rehydration. //Home alone.     Action/Plan:   IV fluids.// [facility]   Anticipated DC Date:  12/04/2013   Anticipated DC Plan:  [facility]  In-house referral  Clinical Social Worker      DC Planning Services  CM consult      Choice offered to / List presented to:             Status of service:  In process, will continue to follow Medicare Important Message given?   (If response is "NO", the following Medicare IM given date fields will be blank) Date Medicare IM given:   Medicare IM given by:   Date Additional Medicare IM given:   Additional Medicare IM given by:    Discharge Disposition:    Per UR Regulation:  Reviewed for med. necessity/level of care/duration of stay  If discussed at Long Length of Stay Meetings, dates discussed:    Comments:  12/02/13 1430 Oletta Cohnamellia Cydney Alvarenga, RN, BSN, [place] [phone] 80 y.o. female who was not heard from by family for a few days and they couldn't reach her.  They called 911 because they couldn't get into her house when they went to check on her today.  Upon arrival of EMS / Fire, they found patient on couch, incontinent of urine and very warm to touch. Temp on scene 103 (103.9 in ED).  Neighbor states that last time they saw patient was last night.  Her house was noted to be extremely hot and [facility] is[facility]

## 2013-12-03 DIAGNOSIS — I10 Essential (primary) hypertension: Secondary | ICD-10-CM

## 2013-12-03 LAB — CBC
HCT: 39.8 % (ref 36.0–46.0)
Hemoglobin: 12.8 g/dL (ref 12.0–15.0)
MCH: 28.3 pg (ref 26.0–34.0)
MCHC: 32.2 g/dL (ref 30.0–36.0)
MCV: 88.1 fL (ref 78.0–100.0)
PLATELETS: 196 10*3/uL (ref 150–400)
RBC: 4.52 MIL/uL (ref 3.87–5.11)
RDW: 15.9 % — ABNORMAL HIGH (ref 11.5–15.5)
WBC: 9.2 10*3/uL (ref 4.0–10.5)

## 2013-12-03 LAB — AMMONIA: AMMONIA: 43 umol/L (ref 11–60)

## 2013-12-03 LAB — BASIC METABOLIC PANEL
Anion gap: 12 (ref 5–15)
BUN: 22 mg/dL (ref 6–23)
CALCIUM: 8.6 mg/dL (ref 8.4–10.5)
CO2: 21 mEq/L (ref 19–32)
CREATININE: 0.82 mg/dL (ref 0.50–1.10)
Chloride: 108 mEq/L (ref 96–112)
GFR, EST AFRICAN AMERICAN: 75 mL/min — AB (ref >90)
GFR, EST NON AFRICAN AMERICAN: 65 mL/min — AB (ref >90)
Glucose, Bld: 93 mg/dL (ref 70–99)
Potassium: 3.7 mEq/L (ref 3.7–5.3)
SODIUM: 141 meq/L (ref 137–147)

## 2013-12-03 LAB — VITAMIN B12: Vitamin B-12: 797 pg/mL (ref 211–911)

## 2013-12-03 LAB — TSH: TSH: 2.24 u[IU]/mL (ref 0.350–4.500)

## 2013-12-03 LAB — HIV ANTIBODY (ROUTINE TESTING W REFLEX): HIV 1&2 Ab, 4th Generation: NONREACTIVE

## 2013-12-03 LAB — FOLATE RBC: RBC Folate: 1063 ng/mL — ABNORMAL HIGH (ref >280)

## 2013-12-03 LAB — RPR

## 2013-12-03 MED ORDER — LORAZEPAM 2 MG/ML IJ SOLN
0.5000 mg | Freq: Once | INTRAMUSCULAR | Status: AC
  Administered 2013-12-03: 0.5 mg via INTRAVENOUS
  Filled 2013-12-03: qty 1

## 2013-12-03 NOTE — Evaluation (Signed)
Physical Therapy Evaluation Patient Details Name: [facility] MRN: [id number] DOB: 03/19/1929 Today's Date: 12/03/2013   History of Present Illness  Pt is 80 y/o female admitted due to Hyperthermia - due to environmental exposure (home with no AC), dehydration and AKI.    Clinical Impression  Pt admitted with the above. Pt currently with functional limitations due to the deficits listed below (see PT Problem List). Limited history provided due to pt overall confusion and noticeable short term memory deficit.  Recommend SNF at this time due to overall safety unclear if family able to provide assistance.  Pt will benefit from skilled PT to increase their independence and safety with mobility to allow discharge to the venue listed below.      Follow Up Recommendations SNF;Supervision/Assistance - 24 hour    Equipment Recommendations   (Need to further assess)    Recommendations for Other Services       Precautions / Restrictions Precautions Precautions: Fall      Mobility  Bed Mobility Overal bed mobility: Needs Assistance Bed Mobility: Supine to Sit     Supine to sit: Min assist     General bed mobility comments: (A) to evelate trunk OOB and cues for safe technique and use of hand rail.   Transfers Overall transfer level: Needs assistance Equipment used: Rolling walker (2 wheeled) Transfers: Sit to/from Stand Sit to Stand: Min assist         General transfer comment: (A) to initiate transfer and slowly descend to recliner with cues for proper hand placement and LE placement.  Pt attempts to sit without body in proper position.   Ambulation/Gait Ambulation/Gait assistance: Min assist Ambulation Distance (Feet): 10 Feet Assistive device: Rolling walker (2 wheeled) Gait Pattern/deviations: Step-through pattern;Decreased stride length;Shuffle   Gait velocity interpretation: Below normal speed for age/gender    Stairs            Wheelchair Mobility     Modified Rankin (Stroke Patients Only)       Balance Overall balance assessment: Needs assistance         Standing balance support: Bilateral upper extremity supported Standing balance-Leahy Scale: Fair                               Pertinent Vitals/Pain Pt c/o bilateral LE pain upon standing unable to rate using pain scale.     Home Living Family/patient expects to be discharged to:: Private residence Living Arrangements: Alone Available Help at Discharge: Other (Comment) (Pt confused but reports no family on sisters and brothers) Type of Home: House           Additional Comments: Pt very confused and intiatlly stated she has no family except brother and sister however later in evaluation pt mentioned grandson.  Therefore unble to get correct information about home environment and any assistance available.     Prior Function Level of Independence: Independent with assistive device(s)               Hand Dominance        Extremity/Trunk Assessment               Lower Extremity Assessment: Generalized weakness         Communication   Communication: No difficulties  Cognition Arousal/Alertness: Awake/alert Behavior During Therapy: WFL for tasks assessed/performed Overall Cognitive Status: Impaired/Different from baseline Area of Impairment: Orientation;Attention;Memory;Safety/judgement;Awareness;Problem solving Orientation Level: Disoriented to;Place;Time;Situation Current Attention Level: Sustained  Memory: Decreased short-term memory   Safety/Judgement: Decreased awareness of safety;Decreased awareness of deficits Awareness: Intellectual Problem Solving: Slow processing      General Comments      Exercises        Assessment/Plan    PT Assessment Patient needs continued PT services  PT Diagnosis Difficulty walking;Generalized weakness   PT Problem List 8 heDecreased strength;Decreased strength;Decreased activity  tolerance;Decreased balance;Decreased mobility;Decreased knowledge of use of DME;Decreased safety awareness;Decreased cognition;Pain  PT Treatment Interventions DME instruction;Gait training;Functional mobility training;Therapeutic exercise;Therapeutic activities;Balance training;Patient/family education   PT Goals (Current goals can be found in the Care Plan section) Acute Rehab PT Goals Patient Stated Goal: Unable to set PT Goal Formulation: With patient Time For Goal Achievement: 12/10/13 Potential to Achieve Goals: Good    Frequency Min 3X/week   Barriers to discharge Decreased caregiver support      Co-evaluation               End of Session Equipment Utilized During Treatment: Gait belt Activity Tolerance: Patient tolerated treatment well Patient left: in chair;with call bell/phone within reach;with chair alarm set Nurse Communication: Mobility status         Time: 1033-1100 PT Time Calculation (min): 27 min   Charges:   PT Evaluation $Initial PT Evaluation Tier I: 1 Procedure PT Treatments $Therapeutic Activity: 8-22 mins   PT G Codes:          Kyla Duffy 12/29/2013, 11:24 AM  Jake Shark, PT DPT [phone]

## 2013-12-03 NOTE — Progress Notes (Signed)
PROGRESS NOTE  Robin Meyers ZOX:[id number] DOB: 03/19/1929 DOA: 12/01/2013 PCP: No PCP Per Patient  Assessment/Plan: Acute encephalopathy  -Multifactorial including infectious process, renal failure, electrolyte disturbance, as well as underlying dementia  -Patient's daughter states that patient has baseline confusion and paranoia  -Suspect the patient has dementia at baseline  -Urinalysis negative for pyuria  -Follow blood cultures--neg -CPK 271  -Check TSH, ammonia, RBC folate,serum B12--all neg -CT brain negative for acute changes  -HIV and RPR pending Acute on chronic renal failure (CKD stage III)  -Due hypovolemia  -IVF-->improving -Avoid nephrotoxic agents  Hypernatremia  -Change to hypotonic fluid  -due to free water deficit  Hypertension  -Start amlodipine  Deconditioning  -PT evaluation-->SNF Fever  -Resolved with intravenous hydration  -Remain off antibiotics as the patient remained hemodynamically stable and fever has not recurred  -Follow cultures Family Communication: Daughter updated at beside  Disposition Plan: Home when medically stable          Procedures/Studies: Ct Head Wo Contrast  12/01/2013   CLINICAL DATA:  Altered mental status.  EXAM: CT HEAD WITHOUT CONTRAST  TECHNIQUE: Contiguous axial images were obtained from the base of the skull through the vertex without intravenous contrast.  COMPARISON:  None.  FINDINGS: There is marked cortical atrophy. Chronic microvascular ischemic change is identified. No evidence of acute abnormality including infarction, hemorrhage, mass lesion, mass effect, midline shift or abnormal extra-axial fluid collection is identified. No hydrocephalus or pneumocephalus. Calvarium intact.  IMPRESSION: No acute finding.  Marked cortical atrophy.   Electronically Signed   By: Drusilla Kannerhomas  Dalessio M.D.   On: 12/01/2013 20:49   Dg Chest Port 1 View  12/01/2013   CLINICAL DATA:  Fever and altered mental status;  suspect sepsis  EXAM: PORTABLE CHEST - 1 VIEW  COMPARISON:  None.  FINDINGS: The lungs are adequately inflated. There is no alveolar infiltrate. Minimal linear density lateral to the left hilar region is present. The pulmonary vascularity is not engorged. There is minimal soft tissue fullness in the upper right paratracheal region likely reflecting vascular structures. There is mild tortuosity of the descending thoracic aorta. There is no pleural effusion. The bony thorax is unremarkable.  IMPRESSION: There is no pneumonia. There may be minimal subsegmental atelectasis peripherally in the left mid lung.   Electronically Signed   By: Reba Hulett  [place]   On: 12/01/2013 20:03         Subjective: Pt is pleasantly confused. Patient denies fevers, chills, headache, chest pain, dyspnea, nausea, vomiting, diarrhea, abdominal pain, dysuria, hematuria   Objective: Filed Vitals:   12/02/13 0008 12/02/13 1446 12/03/13 0157 12/03/13 0628  BP: 156/70 192/65 157/73 147/71  Pulse: 64 65 63 67  Temp: 97.9 F (36.6 C) 97.4 F (36.3 C) 97.9 F (36.6 C) 97.6 F (36.4 C)  TempSrc:  Oral Oral Oral  Resp: 18 20 18 18   Height:      Weight:    63.141 kg (139 lb 3.2 oz)  SpO2: 98% 99% 97% 97%    Intake/Output Summary (Last 24 hours) at 12/03/13 1727 Last data filed at 12/03/13 1033  Gross per 24 hour  Intake   1070 ml  Output    450 ml  Net    620 ml   Weight change: 4.173 kg (9 lb 3.2 oz) Exam:   General:  Pt is alert, follows commands appropriately, not in acute distress  HEENT: No icterus, No thrush, [place]/AT  Cardiovascular:  RRR, S1/S2, no rubs, no gallops  Respiratory: CTA bilaterally, no wheezing, no crackles, no rhonchi  Abdomen: Soft/+BS, non tender, non distended, no guarding  Extremities: No edema, No lymphangitis, No petechiae, No rashes, no synovitis  Data Reviewed: Basic Metabolic Panel:  Recent Labs Lab 12/01/13 1936 12/02/13 0335 12/03/13 0350  NA 147 148* 141  K 3.7 4.2 3.7   CL 109 113* 108  CO2 22 23 21   GLUCOSE 96 100* 93  BUN 23 25* 22  CREATININE 1.44* 1.24* 0.82  CALCIUM 9.2 8.2* 8.6   Liver Function Tests:  Recent Labs Lab 12/01/13 1936  AST 41*  ALT 21  ALKPHOS 53  BILITOT 0.7  PROT 7.8  ALBUMIN 3.7   No results found for this basename: LIPASE, AMYLASE,  in the last 168 hours  Recent Labs Lab 12/03/13 0350  AMMONIA 43   CBC:  Recent Labs Lab 12/01/13 1936 12/02/13 0335 12/03/13 0350  WBC 8.9 10.1 9.2  NEUTROABS 6.6  --   --   HGB 15.0 12.8 12.8  HCT 46.5* 40.4 39.8  MCV 89.6 90.6 88.1  PLT 219 193 196   Cardiac Enzymes:  Recent Labs Lab 12/01/13 1936  CKTOTAL 271*   BNP: No components found with this basename: POCBNP,  CBG: No results found for this basename: GLUCAP,  in the last 168 hours  Recent Results (from the past 240 hour(s))  CULTURE, BLOOD (ROUTINE X 2)     Status: None   Collection Time    12/01/13  7:30 PM      Result Value Ref Range Status   Specimen Description BLOOD RIGHT ARM   Final   Special Requests BOTTLES DRAWN AEROBIC AND ANAEROBIC 10CC   Final   Culture  Setup Time     Final   Value: 12/02/2013 00:45     Performed at [facility]   Culture     Final   Value:        BLOOD CULTURE RECEIVED NO GROWTH TO DATE CULTURE WILL BE HELD FOR 5 DAYS BEFORE ISSUING A FINAL NEGATIVE REPORT     Performed at [facility]   Report Status PENDING   Incomplete  CULTURE, BLOOD (ROUTINE X 2)     Status: None   Collection Time    12/01/13  7:36 PM      Result Value Ref Range Status   Specimen Description BLOOD RIGHT FOREARM   Final   Special Requests BOTTLES DRAWN AEROBIC ONLY 10CC   Final   Culture  Setup Time     Final   Value: 12/02/2013 00:45     Performed at [facility]   Culture     Final   Value:        BLOOD CULTURE RECEIVED NO GROWTH TO DATE CULTURE WILL BE HELD FOR 5 DAYS BEFORE ISSUING A FINAL NEGATIVE REPORT     Performed at [facility]   Report Status  PENDING   Incomplete  URINE CULTURE     Status: None   Collection Time    12/01/13  8:16 PM      Result Value Ref Range Status   Specimen Description URINE, CATHETERIZED   Final   Special Requests NONE   Final   Culture  Setup Time     Final   Value: 12/01/2013 21:27     Performed at [facility] Count     Final   Value: NO GROWTH  Performed at [facility]     Final   Value: NO GROWTH     Performed at [facility]   Report Status 12/02/2013 FINAL   Final     Scheduled Meds: . amLODipine  5 mg Oral Daily  . heparin  5,000 Units Subcutaneous 3 times per day  . multivitamin with minerals  1 tablet Oral Daily  . sodium chloride  3 mL Intravenous Q12H   Continuous Infusions: . sodium chloride 0.45 % 1,000 mL infusion 75 mL/hr at 12/02/13 1930     Kimani Bedoya, DO  [facility] Pager [phone]  If 7PM-7AM, please contact night-coverage [email or web address] Password [email or web address] 12/03/2013, 5:27 PM   LOS: 2 days

## 2013-12-03 NOTE — Plan of Care (Signed)
Problem: Phase I Progression Outcomes Goal: OOB as tolerated unless otherwise ordered Outcome: Completed/Met Date Met:  12/03/13 With PT

## 2013-12-04 LAB — BASIC METABOLIC PANEL
Anion gap: 10 (ref 5–15)
BUN: 15 mg/dL (ref 6–23)
CO2: 22 meq/L (ref 19–32)
Calcium: 8.5 mg/dL (ref 8.4–10.5)
Chloride: 105 mEq/L (ref 96–112)
Creatinine, Ser: 0.86 mg/dL (ref 0.50–1.10)
GFR calc Af Amer: 71 mL/min — ABNORMAL LOW (ref >90)
GFR calc non Af Amer: 61 mL/min — ABNORMAL LOW (ref >90)
GLUCOSE: 103 mg/dL — AB (ref 70–99)
POTASSIUM: 4 meq/L (ref 3.7–5.3)
Sodium: 137 mEq/L (ref 137–147)

## 2013-12-04 MED ORDER — LORAZEPAM 0.5 MG PO TABS
0.5000 mg | ORAL_TABLET | Freq: Once | ORAL | Status: DC
  Filled 2013-12-04: qty 1

## 2013-12-04 MED ORDER — LORAZEPAM 2 MG/ML IJ SOLN
0.5000 mg | Freq: Once | INTRAMUSCULAR | Status: AC
  Administered 2013-12-04: 0.5 mg via INTRAVENOUS
  Filled 2013-12-04: qty 1

## 2013-12-04 NOTE — Progress Notes (Signed)
PROGRESS NOTE  Robin Meyers YQM:[id number] DOB: 03/19/1929 DOA: 12/01/2013 PCP: No PCP Per Patient  Assessment/Plan: Acute encephalopathy  -Multifactorial including infectious process, renal failure, electrolyte disturbance, as well as underlying dementia--daughter states pt is at baseline on 12/04/13 -Patient's daughter states that patient has baseline confusion and paranoia  -Suspect the patient has dementia at baseline  -Urinalysis negative for pyuria  -Follow blood cultures--neg  -CPK 271  -Check TSH, ammonia, RBC folate,serum B12--all neg  -CT brain negative for acute changes  -HIV and RPR pending  Acute on chronic renal failure (CKD stage III)  -Due hypovolemia  -IVF-->improving-->saline lock now that pt is taking po -Avoid nephrotoxic agents  Hypernatremia  -Change to hypotonic fluid-->resolved  -due to free water deficit  Hypertension  -Start amlodipine  Deconditioning  -PT evaluation-->SNF  Fever  -Resolved with intravenous hydration  -Remain off antibiotics as the patient remained hemodynamically stable and fever has not recurred  -Follow cultures--blood and urine cultures are neg Family Communication: Daughter updated at beside  Disposition Plan: Home when medically stable          Procedures/Studies: Ct Head Wo Contrast  12/01/2013   CLINICAL DATA:  Altered mental status.  EXAM: CT HEAD WITHOUT CONTRAST  TECHNIQUE: Contiguous axial images were obtained from the base of the skull through the vertex without intravenous contrast.  COMPARISON:  None.  FINDINGS: There is marked cortical atrophy. Chronic microvascular ischemic change is identified. No evidence of acute abnormality including infarction, hemorrhage, mass lesion, mass effect, midline shift or abnormal extra-axial fluid collection is identified. No hydrocephalus or pneumocephalus. Calvarium intact.  IMPRESSION: No acute finding.  Marked cortical atrophy.   Electronically Signed   By: Drusilla Kannerhomas   Dalessio M.D.   On: 12/01/2013 20:49   Dg Chest Port 1 View  12/01/2013   CLINICAL DATA:  Fever and altered mental status; suspect sepsis  EXAM: PORTABLE CHEST - 1 VIEW  COMPARISON:  None.  FINDINGS: The lungs are adequately inflated. There is no alveolar infiltrate. Minimal linear density lateral to the left hilar region is present. The pulmonary vascularity is not engorged. There is minimal soft tissue fullness in the upper right paratracheal region likely reflecting vascular structures. There is mild tortuosity of the descending thoracic aorta. There is no pleural effusion. The bony thorax is unremarkable.  IMPRESSION: There is no pneumonia. There may be minimal subsegmental atelectasis peripherally in the left mid lung.   Electronically Signed   By: Darlette Dubow  [place]   On: 12/01/2013 20:03         Subjective: Patient denies fevers, chills, headache, chest pain, dyspnea, nausea, vomiting, diarrhea, abdominal pain, dysuria, hematuria   Objective: Filed Vitals:   12/03/13 2035 12/03/13 2300 12/04/13 0628 12/04/13 0632  BP: 180/89   155/60  Pulse: 68   67  Temp: 97.6 F (36.4 C)   97.3 F (36.3 C)  TempSrc: Oral   Axillary  Resp: 18   16  Height:      Weight:   61.735 kg (136 lb 1.6 oz)   SpO2: 100% 99%  100%    Intake/Output Summary (Last 24 hours) at 12/04/13 1838 Last data filed at 12/04/13 1800  Gross per 24 hour  Intake   2445 ml  Output   1101 ml  Net   1344 ml   Weight change: -1.406 kg (-3 lb 1.6 oz) Exam:   General:  Pt is alert, follows commands appropriately, not in acute  distress  HEENT: No icterus, No thrush, [place]/AT  Cardiovascular: RRR, S1/S2, no rubs, no gallops  Respiratory: CTA bilaterally, no wheezing, no crackles, no rhonchi  Abdomen: Soft/+BS, non tender, non distended, no guarding  Extremities: No edema, No lymphangitis, No petechiae, No rashes, no synovitis  Data Reviewed: Basic Metabolic Panel:  Recent Labs Lab 12/01/13 1936 12/02/13 0335  12/03/13 0350 12/04/13 0630  NA 147 148* 141 137  K 3.7 4.2 3.7 4.0  CL 109 113* 108 105  CO2 22 23 21 22   GLUCOSE 96 100* 93 103*  BUN 23 25* 22 15  CREATININE 1.44* 1.24* 0.82 0.86  CALCIUM 9.2 8.2* 8.6 8.5   Liver Function Tests:  Recent Labs Lab 12/01/13 1936  AST 41*  ALT 21  ALKPHOS 53  BILITOT 0.7  PROT 7.8  ALBUMIN 3.7   No results found for this basename: LIPASE, AMYLASE,  in the last 168 hours  Recent Labs Lab 12/03/13 0350  AMMONIA 43   CBC:  Recent Labs Lab 12/01/13 1936 12/02/13 0335 12/03/13 0350  WBC 8.9 10.1 9.2  NEUTROABS 6.6  --   --   HGB 15.0 12.8 12.8  HCT 46.5* 40.4 39.8  MCV 89.6 90.6 88.1  PLT 219 193 196   Cardiac Enzymes:  Recent Labs Lab 12/01/13 1936  CKTOTAL 271*   BNP: No components found with this basename: POCBNP,  CBG: No results found for this basename: GLUCAP,  in the last 168 hours  Recent Results (from the past 240 hour(s))  CULTURE, BLOOD (ROUTINE X 2)     Status: None   Collection Time    12/01/13  7:30 PM      Result Value Ref Range Status   Specimen Description BLOOD RIGHT ARM   Final   Special Requests BOTTLES DRAWN AEROBIC AND ANAEROBIC 10CC   Final   Culture  Setup Time     Final   Value: 12/02/2013 00:45     Performed at [facility]   Culture     Final   Value:        BLOOD CULTURE RECEIVED NO GROWTH TO DATE CULTURE WILL BE HELD FOR 5 DAYS BEFORE ISSUING A FINAL NEGATIVE REPORT     Performed at [facility]   Report Status PENDING   Incomplete  CULTURE, BLOOD (ROUTINE X 2)     Status: None   Collection Time    12/01/13  7:36 PM      Result Value Ref Range Status   Specimen Description BLOOD RIGHT FOREARM   Final   Special Requests BOTTLES DRAWN AEROBIC ONLY 10CC   Final   Culture  Setup Time     Final   Value: 12/02/2013 00:45     Performed at [facility]   Culture     Final   Value:        BLOOD CULTURE RECEIVED NO GROWTH TO DATE CULTURE WILL BE HELD FOR 5 DAYS  BEFORE ISSUING A FINAL NEGATIVE REPORT     Performed at [facility]   Report Status PENDING   Incomplete  URINE CULTURE     Status: None   Collection Time    12/01/13  8:16 PM      Result Value Ref Range Status   Specimen Description URINE, CATHETERIZED   Final   Special Requests NONE   Final   Culture  Setup Time     Final   Value: 12/01/2013 21:27  Performed at [facility] Count     Final   Value: NO GROWTH     Performed at [facility]   Culture     Final   Value: NO GROWTH     Performed at [facility]   Report Status 12/02/2013 FINAL   Final     Scheduled Meds: . amLODipine  5 mg Oral Daily  . heparin  5,000 Units Subcutaneous 3 times per day  . multivitamin with minerals  1 tablet Oral Daily  . sodium chloride  3 mL Intravenous Q12H   Continuous Infusions: . sodium chloride 0.45 % 1,000 mL infusion 75 mL/hr at 12/04/13 1758     Jermisha Hoffart, DO  [facility] Pager [phone]  If 7PM-7AM, please contact night-coverage [email or web address] Password [email or web address] 12/04/2013, 6:38 PM   LOS: 3 days

## 2013-12-05 DIAGNOSIS — R404 Transient alteration of awareness: Secondary | ICD-10-CM

## 2013-12-05 MED ORDER — HALOPERIDOL LACTATE 5 MG/ML IJ SOLN: 5.0000 mg | Freq: Once | INTRAMUSCULAR | Status: AC

## 2013-12-05 MED ORDER — AMLODIPINE BESYLATE 5 MG PO TABS: 5.0000 mg | ORAL_TABLET | Freq: Every day | ORAL | Status: DC

## 2013-12-05 MED ORDER — HALOPERIDOL LACTATE 5 MG/ML IJ SOLN
INTRAMUSCULAR | Status: AC
  Administered 2013-12-05: 5 mg
  Filled 2013-12-05: qty 1

## 2013-12-05 NOTE — Progress Notes (Signed)
Clinical Social Work Department BRIEF PSYCHOSOCIAL ASSESSMENT 12/05/2013  Patient:  Robin Meyers,Robin Meyers     Account Number:  [id number]     Admit date:  12/01/2013  Clinical Social Worker:  Lourdes SledgeWILSON,Denaly Gatling, LCSWA  Date/Time:  12/05/2013 11:53 AM  Referred by:  Physician  Date Referred:  12/05/2013 Referred for  SNF Placement   Other Referral:   Interview type:  Family Other interview type:   CSW completed assessment with pt daughter Harriet PhoYvette White [phone]    PSYCHOSOCIAL DATA Living Status:  ALONE Admitted from facility:   Level of care:   Primary support name:  Harriet PhoYvette White [phone] Primary support relationship to patient:  CHILD, ADULT Degree of support available:   Pt daughter appears supportive and involved in care however pt lives alone and needs additional care at discharge.    CURRENT CONCERNS Current Concerns  Post-Acute Placement   Other Concerns:    SOCIAL WORK ASSESSMENT / PLAN Covering CSW informed that pt is possibly ready for dc today and is needing SNF placement.    CSW unable to complete assessment with pt who presents confused. CSW contacted pt daughter Bronson IngYvette who confirmed that pt lives home alone and is needing SNF placement. CSW inquired about pt's insurance however daughter stated she does have Medicare. Daughter stated she saw pt's old card in pt's home and will get the card in order for the financial counselor to try and look her up. Pt confirms that pt does not have Medicaid however daughter has plans to apply for Medicaid for pt.    CSW informed daughter that SNF placement will be an expensive cost if pt does not have insurance. Daughter understanding that she may need to take pt home at discharge.    CSW contacted the financial counselor who was familiar with the case. Financial counselor has contacted daughter and is awaiting daughters call with information on pt's old Medicare card.    CSW to do a SNF search and submit for a pasrr.    Assessment/plan status:  Psychosocial Support/Ongoing Assessment of Needs Other assessment/ plan:   Information/referral to community resources:   CSW provided pt with education regarding Medicare/Medicaid and the cost of private pay. CSW to provide a SNF list.    PATIENT'S/FAMILY'S RESPONSE TO PLAN OF CARE: Pt presents confused. CSW completed assessment with pt daughter Bronson IngYvette who is agreeable to SNF placement/pt returning home.    If pt is unable to go to SNF CSW will request for RNCM to discuss possible HH with daughter.       Theresia BoughNorma Crecencio Kwiatek, MSW, LCSW [phone]

## 2013-12-05 NOTE — Progress Notes (Addendum)
[facility], that pt has 100 Medicare days available. [facility] [place]. CSW to await dc paperwork and will then arrange transportation.  PTAR contacted for transportation, daughter Ms. White aware and agreeable.  No additional needs, CSW signing of.  Theresia BoughNorma Zakariye Nee, MSW, LCSW [phone]

## 2013-12-05 NOTE — Progress Notes (Signed)
PT Cancellation Note  Patient Details Name: Boneta LucksMargaret W Gilchrest MRN: [id number] DOB: 03/19/1929   Cancelled Treatment:    Reason Eval/Treat Not Completed: Fatigue/lethargy limiting ability to participate.  Per nursing pt has had restless AM, pulling out Foley, etc.  Pt is now asleep and too fatigued to participate.   Janelle Culton LUBECK 12/05/2013, 11:55 AM

## 2013-12-05 NOTE — Discharge Summary (Signed)
Physician Discharge Summary  Robin Meyers JXB:[id number] DOB: 12-May-1929 DOA: 12/01/2013  PCP: No PCP Per Patient  Admit date: 12/01/2013 Discharge date: 12/05/2013  Recommendations for Outpatient Follow-up:  1. Pt will need to follow up with PCP in 2 weeks post discharge 2. Please obtain BMP 1 week   Discharge Diagnoses:  Acute encephalopathy  -Multifactorial including infectious process, renal failure, electrolyte disturbance, as well as underlying dementia--daughter states pt is at baseline on 12/04/13  -Patient's daughter states that patient is at baseline confusion and paranoia  -Suspect the patient has dementia at baseline  -Urinalysis negative for pyuria  -Follow blood cultures--neg  -CPK 271  -Check TSH, ammonia, RBC folate,serum B12--all neg  -CT brain negative for acute changes  -HIV and RPR--neg -Evaluated by psychiatry--patient does not have capacity to make decisions  Acute on chronic renal failure (CKD stage III)  -Due hypovolemia  -IVF-->improving-->saline lock now that pt is taking po  -Avoid nephrotoxic agents  Hypernatremia  -Change to hypotonic fluid-->resolved  -due to free water deficit  Hypertension  -Started amlodipine-->stable  Deconditioning  -PT evaluation-->SNF  Fever  -Resolved with intravenous hydration  -Remain off antibiotics as the patient remained hemodynamically stable and fever has not recurred  -Follow cultures--blood and urine cultures are neg  Family Communication: Daughter updated at beside  Disposition Plan: Home when medically stable   Discharge Condition: Stable  Disposition:  SNF  Diet:heart healthy Wt Readings from Last 3 Encounters:  12/05/13 60.873 kg (134 lb 3.2 oz)    History of present illness:  80 year old female with a history of hypertension, CKD stage III, hyperlipidemia presents to the hospital with fever and confusion. On the one from her family had heard from the patient for 3 days, her daughter went to  check on her, but could not get into the house. EMS was activated and broken to the hospital from the patient lying on the couch confused lying in her urine. In emergency department, the patient had temperature 103.59F. Her fever improved with intravenous hydration as well as her mentation. According to the patient's daughter, the patient has had a cognitive decline for over one year with increased paranoia. There has not been any reports of recent vomiting, diarrhea, hematuria. There've been no reports of recent chest discomfort, shortness breath, syncope. However, the patient takes numerous NSAIDs for various body aches. Blood cultures and urine cultures were obtained at the time of admission. They have remained negative. The patient was not started on any antibiotics. The patient remained clinically stable, afebrile, and hemodynamically stable throughout the entire hospitalization. The patient was noted to be hypernatremic. The patient was treated with hypotonic fluid which helped resolve the hypernatremia. After discussion with the family, it was felt prudent that the patient go to a skilled nursing facility. Social work assisted  in this transition period. The patient had intermittent episodes of confusion. She pulled out her Foley catheter traumatically resulting in some hematuria. The patient was given Haldol with improvement. Psychiatry evaluated the patient and found the patient not to have capacity to make medical decisions. The patient was noted to be hypertensive. The patient was started on amlodipine with improvement of her blood pressure which remained stable.    Discharge Exam: Filed Vitals:   12/05/13 1538  BP: 176/68  Pulse: 66  Temp: 98.2 F (36.8 C)  Resp: 16   Filed Vitals:   12/04/13 0632 12/04/13 2100 12/05/13 0643 12/05/13 1538  BP: 155/60 150/57 168/66 176/68  Pulse: 67 70 64  66  Temp: 97.3 F (36.3 C) 98.1 F (36.7 C) 97.4 F (36.3 C) 98.2 F (36.8 C)  TempSrc:  Axillary Axillary Axillary Oral  Resp: 16 15 16 16   Height:      Weight:   60.873 kg (134 lb 3.2 oz)   SpO2: 100% 99% 100% 98%   General: Awake and alert, NAD, pleasant, cooperative Cardiovascular: RRR, no rub, no gallop, no S3 Respiratory: CTAB, no wheeze, no rhonchi Abdomen:soft, nontender, nondistended, positive bowel sounds Extremities: No edema, No lymphangitis, no petechiae  Discharge Instructions      Discharge Instructions   Diet - low sodium heart healthy    Complete by:  As directed      Increase activity slowly    Complete by:  As directed             Medication List    STOP taking these medications       ibuprofen 100 MG tablet  Commonly known as:  ADVIL,MOTRIN     naproxen sodium 220 MG tablet  Commonly known as:  ANAPROX      TAKE these medications       acetaminophen 325 MG tablet  Commonly known as:  TYLENOL  Take 325-650 mg by mouth every 6 (six) hours as needed for mild pain.     amLODipine 5 MG tablet  Commonly known as:  NORVASC  Take 1 tablet (5 mg total) by mouth daily.     aspirin 325 MG tablet  Take 325 mg by mouth daily as needed for mild pain.     multivitamin with minerals Tabs tablet  Take 1 tablet by mouth daily.         The results of significant diagnostics from this hospitalization (including imaging, microbiology, ancillary and laboratory) are listed below for reference.    Significant Diagnostic Studies: Ct Head Wo Contrast  12/01/2013   CLINICAL DATA:  Altered mental status.  EXAM: CT HEAD WITHOUT CONTRAST  TECHNIQUE: Contiguous axial images were obtained from the base of the skull through the vertex without intravenous contrast.  COMPARISON:  None.  FINDINGS: There is marked cortical atrophy. Chronic microvascular ischemic change is identified. No evidence of acute abnormality including infarction, hemorrhage, mass lesion, mass effect, midline shift or abnormal extra-axial fluid collection is identified. No hydrocephalus  or pneumocephalus. Calvarium intact.  IMPRESSION: No acute finding.  Marked cortical atrophy.   Electronically Signed   By: Drusilla Kanner M.D.   On: 12/01/2013 20:49   Dg Chest Port 1 View  12/01/2013   CLINICAL DATA:  Fever and altered mental status; suspect sepsis  EXAM: PORTABLE CHEST - 1 VIEW  COMPARISON:  None.  FINDINGS: The lungs are adequately inflated. There is no alveolar infiltrate. Minimal linear density lateral to the left hilar region is present. The pulmonary vascularity is not engorged. There is minimal soft tissue fullness in the upper right paratracheal region likely reflecting vascular structures. There is mild tortuosity of the descending thoracic aorta. There is no pleural effusion. The bony thorax is unremarkable.  IMPRESSION: There is no pneumonia. There may be minimal subsegmental atelectasis peripherally in the left mid lung.   Electronically Signed   By: Dmiya Malphrus  [place]   On: 12/01/2013 20:03     Microbiology: Recent Results (from the past 240 hour(s))  CULTURE, BLOOD (ROUTINE X 2)     Status: None   Collection Time    12/01/13  7:30 PM      Result Value Ref Range Status  Specimen Description BLOOD RIGHT ARM   Final   Special Requests BOTTLES DRAWN AEROBIC AND ANAEROBIC 10CC   Final   Culture  Setup Time     Final   Value: 12/02/2013 00:45     Performed at [facility]   Culture     Final   Value:        BLOOD CULTURE RECEIVED NO GROWTH TO DATE CULTURE WILL BE HELD FOR 5 DAYS BEFORE ISSUING A FINAL NEGATIVE REPORT     Performed at [facility]   Report Status PENDING   Incomplete  CULTURE, BLOOD (ROUTINE X 2)     Status: None   Collection Time    12/01/13  7:36 PM      Result Value Ref Range Status   Specimen Description BLOOD RIGHT FOREARM   Final   Special Requests BOTTLES DRAWN AEROBIC ONLY 10CC   Final   Culture  Setup Time     Final   Value: 12/02/2013 00:45     Performed at [facility]   Culture     Final   Value:        BLOOD  CULTURE RECEIVED NO GROWTH TO DATE CULTURE WILL BE HELD FOR 5 DAYS BEFORE ISSUING A FINAL NEGATIVE REPORT     Performed at [facility]   Report Status PENDING   Incomplete  URINE CULTURE     Status: None   Collection Time    12/01/13  8:16 PM      Result Value Ref Range Status   Specimen Description URINE, CATHETERIZED   Final   Special Requests NONE   Final   Culture  Setup Time     Final   Value: 12/01/2013 21:27     Performed at [facility]   Colony Count     Final   Value: NO GROWTH     Performed at [facility]   Culture     Final   Value: NO GROWTH     Performed at [facility]   Report Status 12/02/2013 FINAL   Final     Labs: Basic Metabolic Panel:  Recent Labs Lab 12/01/13 1936 12/02/13 0335 12/03/13 0350 12/04/13 0630  NA 147 148* 141 137  K 3.7 4.2 3.7 4.0  CL 109 113* 108 105  CO2 22 23 21 22   GLUCOSE 96 100* 93 103*  BUN 23 25* 22 15  CREATININE 1.44* 1.24* 0.82 0.86  CALCIUM 9.2 8.2* 8.6 8.5   Liver Function Tests:  Recent Labs Lab 12/01/13 1936  AST 41*  ALT 21  ALKPHOS 53  BILITOT 0.7  PROT 7.8  ALBUMIN 3.7   No results found for this basename: LIPASE, AMYLASE,  in the last 168 hours  Recent Labs Lab 12/03/13 0350  AMMONIA 43   CBC:  Recent Labs Lab 12/01/13 1936 12/02/13 0335 12/03/13 0350  WBC 8.9 10.1 9.2  NEUTROABS 6.6  --   --   HGB 15.0 12.8 12.8  HCT 46.5* 40.4 39.8  MCV 89.6 90.6 88.1  PLT 219 193 196   Cardiac Enzymes:  Recent Labs Lab 12/01/13 1936  CKTOTAL 271*   BNP: No components found with this basename: POCBNP,  CBG: No results found for this basename: GLUCAP,  in the last 168 hours  Time coordinating discharge:  Greater than 30 minutes  Signed:  Porcha Deblanc, DO [facility] Pager: [phone] 12/05/2013, 4:07 PM

## 2013-12-05 NOTE — Progress Notes (Signed)
Pt exited bed, bed alarm sounded. Tech in room immediately. Pt with stool all over self and pt pulled foley cath out with balloon intact. Pt with bright red blood (small amount) from urethra. Pt able to state name/dob but unable to state time or location. Pt refusing care from myself or the nurse tech. Pt sitting in chair. Dr. Arbutus Leasat paged. Order to give 5 MG IV haldol. Order to leave foley out and monitor for urine output. If no UO by 1 pm will notify Dr. Arbutus Leasat. Will continue to monitor.  Asher Muir- Lalania Haseman,RN

## 2013-12-05 NOTE — Consult Note (Signed)
[facility] Face-to-Face Psychiatry Consult   Reason for Consult:  Capacity Referring Physician:  Dr Tat  Robin Meyers is an 80 y.o. female. Total Time spent with patient: 20 minutes  Assessment: AXIS I:  [place]ium AXIS II:  Deferred AXIS III:   Past Medical History  Diagnosis Date  . Hypertension   . Chronic kidney disease     kidney stones   AXIS IV:  other psychosocial or environmental problems, problems related to social environment and problems with primary support group AXIS V:  31-40 impairment in reality testing  Plan:  Patient does not have capacity to participate in her treatment plan.  Subjective:   Robin Meyers is a 80 y.o. female patient admitted with hyperthermia.  HPI:  Patient seen and chart reviewed.  Patient is 80 year old African American female who was admitted because she was found on a couch with incontinent of urine.  Apparently family members were unable to reach her for a few days.  They called 911 and patient was found incontinent.  Patient is a poor historian and she did not provide much information.  She does not know and that she is in the hospital.  She mentioned she has leg pain.  She appears confused and irrational.  As per medical record, her daughter mentioned that she has cognitive decline for more than one year and sometime increased paranoia.  The patient lives by herself however she has been unable to manage on her own.  Patient did not participate in discussion.  She remained her eyes close.  The patient has given Haldol for agitation when she tried to pull her out foley catheter.  At this time patient has no emotional and cognitive insight into her illness.  She cannot participate in her treatment plan.  She cannot take care of herself and requires skilled nursing facility.   Past Psychiatric History: Past Medical History  Diagnosis Date  . Hypertension   . Chronic kidney disease     kidney stones    reports that she quit smoking about 45  years ago. Her smoking use included Cigarettes. She smoked 0.00 packs per day. She does not have any smokeless tobacco history on file. She reports that she does not drink alcohol. Her drug history is not on file. No family history on file.   Living Arrangements: Alone   Abuse/Neglect [facility]) Verbal Abuse: Denies Sexual Abuse: Denies Allergies:  No Known Allergies  ACT Assessment Complete:  No:   Past Psychiatric History: Patient is a poor historian.  She is confused and cannot remember anything.  Objective: Blood pressure 168/66, pulse 64, temperature 97.4 F (36.3 C), temperature source Axillary, resp. rate 16, height '5\' 6"'  (1.676 m), weight 134 lb 3.2 oz (60.873 kg), SpO2 100.00%.Body mass index is 21.67 kg/(m^2). Results for orders placed during the hospital encounter of 12/01/13 (from the past 72 hour(s))  BASIC METABOLIC PANEL     Status: Abnormal   Collection Time    12/03/13  3:50 AM      Result Value Ref Range   Sodium 141  137 - 147 mEq/L   Potassium 3.7  3.7 - 5.3 mEq/L   Chloride 108  96 - 112 mEq/L   CO2 21  19 - 32 mEq/L   Glucose, Bld 93  70 - 99 mg/dL   BUN 22  6 - 23 mg/dL   Creatinine, Ser 0.82  0.50 - 1.10 mg/dL   Comment: DELTA CHECK NOTED   Calcium 8.6  8.4 -  10.5 mg/dL   GFR calc non Af Amer 65 (*) >90 mL/min   GFR calc Af Amer 75 (*) >90 mL/min   Comment: (NOTE)     The eGFR has been calculated using the CKD EPI equation.     This calculation has not been validated in all clinical situations.     eGFR's persistently <90 mL/min signify possible Chronic Kidney     Disease.   Anion gap 12  5 - 15  CBC     Status: Abnormal   Collection Time    12/03/13  3:50 AM      Result Value Ref Range   WBC 9.2  4.0 - 10.5 K/uL   RBC 4.52  3.87 - 5.11 MIL/uL   Hemoglobin 12.8  12.0 - 15.0 g/dL   HCT 39.8  36.0 - 46.0 %   MCV 88.1  78.0 - 100.0 fL   MCH 28.3  26.0 - 34.0 pg   MCHC 32.2  30.0 - 36.0 g/dL   RDW 15.9 (*) 11.5 - 15.5 %   Platelets 196  150 - 400 K/uL   VITAMIN B12     Status: None   Collection Time    12/03/13  3:50 AM      Result Value Ref Range   Vitamin B-12 797  211 - 911 pg/mL   Comment: Performed at [facility]     Status: None   Collection Time    12/03/13  3:50 AM      Result Value Ref Range   Ammonia 43  11 - 60 umol/L  RPR     Status: None   Collection Time    12/03/13  3:50 AM      Result Value Ref Range   RPR NON REAC  NON REAC   Comment: Performed at [facility]  HIV ANTIBODY (ROUTINE TESTING)     Status: None   Collection Time    12/03/13  3:50 AM      Result Value Ref Range   HIV 1&2 Ab, 4th Generation NONREACTIVE  NONREACTIVE   Comment: (NOTE)     A NONREACTIVE HIV Ag/Ab result does not exclude HIV infection since     the time frame for seroconversion is variable. If acute HIV infection     is suspected, a HIV-1 RNA Qualitative TMA test is recommended.     HIV-1/2 Antibody Diff         Not indicated.     HIV-1 RNA, Qual TMA           Not indicated.     PLEASE NOTE: This information has been disclosed to you from records     whose confidentiality may be protected by state law. If your state     requires such protection, then the state law prohibits you from making     any further disclosure of the information without the specific written     consent of the person to whom it pertains, or as otherwise permitted     by law. A general authorization for the release of medical or other     information is NOT sufficient for this purpose.     The performance of this assay has not been clinically validated in     patients less than 54 years old.     Performed at [facility]     Status: Abnormal   Collection Time    12/03/13  3:50  AM      Result Value Ref Range   RBC Folate 1063 (*) >280 ng/mL   Comment: Reference range not established for pediatric patients.     Performed at [facility]  TSH     Status: None   Collection Time    12/03/13  3:50 AM       Result Value Ref Range   TSH 2.240  0.350 - 4.500 uIU/mL  BASIC METABOLIC PANEL     Status: Abnormal   Collection Time    12/04/13  6:30 AM      Result Value Ref Range   Sodium 137  137 - 147 mEq/L   Potassium 4.0  3.7 - 5.3 mEq/L   Chloride 105  96 - 112 mEq/L   CO2 22  19 - 32 mEq/L   Glucose, Bld 103 (*) 70 - 99 mg/dL   BUN 15  6 - 23 mg/dL   Creatinine, Ser 0.86  0.50 - 1.10 mg/dL   Calcium 8.5  8.4 - 10.5 mg/dL   GFR calc non Af Amer 61 (*) >90 mL/min   GFR calc Af Amer 71 (*) >90 mL/min   Comment: (NOTE)     The eGFR has been calculated using the CKD EPI equation.     This calculation has not been validated in all clinical situations.     eGFR's persistently <90 mL/min signify possible Chronic Kidney     Disease.   Anion gap 10  5 - 15   Labs are reviewed.  Current Facility-Administered Medications  Medication Dose Route Frequency Provider Last Rate Last Dose  . acetaminophen (TYLENOL) tablet 325-650 mg  325-650 mg Oral Q6H PRN Etta Quill, DO      . amLODipine (NORVASC) tablet 5 mg  5 mg Oral Daily Orson Eva, MD   5 mg at 12/04/13 1001  . aspirin tablet 325 mg  325 mg Oral Daily PRN Etta Quill, DO      . heparin injection 5,000 Units  5,000 Units Subcutaneous 3 times per day Etta Quill, DO   5,000 Units at 12/05/13 [phone]  . multivitamin with minerals tablet 1 tablet  1 tablet Oral Daily Etta Quill, DO   1 tablet at 12/04/13 1001  . sodium chloride 0.9 % injection 3 mL  3 mL Intravenous Q12H Etta Quill, DO   3 mL at 12/05/13 1058    Psychiatric Specialty Exam:     Blood pressure 168/66, pulse 64, temperature 97.4 F (36.3 C), temperature source Axillary, resp. rate 16, height '5\' 6"'  (1.676 m), weight 134 lb 3.2 oz (60.873 kg), SpO2 100.00%.Body mass index is 21.67 kg/(m^2).  General Appearance: Disheveled, confused  Eye Contact::  Absent  Speech:  Slow and Slurred  Volume:  Decreased  Mood:  Irritable  Affect:  Inappropriate  Thought Process:   Irrelevant, Loose and Tangential  Orientation:  Negative  Thought Content:  Confusion and disorientation  Suicidal Thoughts:  No  Homicidal Thoughts:  No  Memory:  Immediate;   Poor Recent;   Poor Remote;   Poor  Judgement:  Impaired  Insight:  Lacking  Psychomotor Activity:  Restlessness  Concentration:  Poor  Recall:  Poor  Fund of Knowledge:Poor  Language: Poor  Akathisia:  No  Handed:  Right  AIMS (if indicated):     Assets:  Social Support  Sleep:      Musculoskeletal: Strength & Muscle Tone: Unable to assess Gait & Station: Unable to  assess because patient is lying on the bed Patient leans: N/A  Treatment Plan Summary: Patient is unable to participate in her treatment plan. Use Haldol when necessary for agitation.  Please call [phone] if you have any further questions.  Tobenna Needs T. 12/05/2013 12:17 PM

## 2013-12-05 NOTE — Progress Notes (Addendum)
Clinical Social Work Department CLINICAL SOCIAL WORK PLACEMENT NOTE 12/05/2013  Patient:  Robin Meyers,Robin Meyers  Account Number:  [id number] Admit date:  12/01/2013  Clinical Social Worker:  Robin Meyers, Robin Meyers  Date/time:  12/05/2013 12:21 PM  Clinical Social Work is seeking post-discharge placement for this patient at the following level of care:   SKILLED NURSING   (*CSW will update this form in Epic as items are completed)   12/05/2013  Patient/family provided with [facility] Department of Clinical Social Work's list of facilities offering this level of care within the geographic area requested by the patient (or if unable, by the patient's family).  12/05/2013  Patient/family informed of their freedom to choose among providers that offer the needed level of care, that participate in Medicare, Medicaid or managed care program needed by the patient, have an available bed and are willing to accept the patient.  12/05/2013  Patient/family informed of [facility]' ownership interest in [facility], as well as of the fact that they are under no obligation to receive care at this facility.  PASARR submitted to EDS on 12/05/2013 PASARR number received on   FL2 transmitted to all facilities in geographic area requested by pt/family on  12/05/2013 FL2 transmitted to all facilities within larger geographic area on   Patient informed that his/her managed care company has contracts with or will negotiate with  certain facilities, including the following:     Patient/family informed of bed offers received:  12/05/13 Patient chooses bed at [facility] [place] Physician recommends and patient chooses bed at    Patient to be transferred to [facility] [place] on  12/05/13 Patient to be transferred to facility by [facility] Patient and family notified of transfer on 12/05/13 Name of family member notified:  Robin Meyers  The following physician request were entered in  Epic:   Additional Comments:  Robin BoughNorma Ahmere Hemenway, MSW, LCSW [phone]

## 2013-12-06 ENCOUNTER — Non-Acute Institutional Stay (SKILLED_NURSING_FACILITY): Payer: Medicare Other | Admitting: Internal Medicine

## 2013-12-06 ENCOUNTER — Encounter: Payer: Self-pay | Admitting: Internal Medicine

## 2013-12-06 DIAGNOSIS — N179 Acute kidney failure, unspecified: Secondary | ICD-10-CM

## 2013-12-06 DIAGNOSIS — E785 Hyperlipidemia, unspecified: Secondary | ICD-10-CM | POA: Diagnosis not present

## 2013-12-06 DIAGNOSIS — I10 Essential (primary) hypertension: Secondary | ICD-10-CM

## 2013-12-06 DIAGNOSIS — G9340 Encephalopathy, unspecified: Secondary | ICD-10-CM | POA: Diagnosis not present

## 2013-12-06 DIAGNOSIS — G309 Alzheimer's disease, unspecified: Secondary | ICD-10-CM | POA: Diagnosis not present

## 2013-12-06 DIAGNOSIS — F0280 Dementia in other diseases classified elsewhere without behavioral disturbance: Secondary | ICD-10-CM

## 2013-12-06 DIAGNOSIS — N189 Chronic kidney disease, unspecified: Secondary | ICD-10-CM | POA: Diagnosis not present

## 2013-12-06 DIAGNOSIS — F22 Delusional disorders: Secondary | ICD-10-CM

## 2013-12-06 NOTE — Progress Notes (Signed)
Patient ID: Robin Meyers, female   DOB: 03/19/1929, 80 y.o.   MRN: [id number]  Provider:  Gwenith Spitziffany L. Renato Gailseed, D.O., C.M.D. Location:  [facility]  PCP: No PCP Per Patient  Code Status: full code  No Known Allergies  Chief Complaint  Patient presents with  . Hospitalization Follow-up    new admission for short term rehab after hospitalization 7/23-7/27    HPI: 80 y.o. female with h/o dementia, paranoia, htn, CKD III, and hyperlipidemia is here for short term rehab s/p hospitalization 7/23-27 with hyperthermia to 103.9, hypernatremia, acute on chronic renal failure, delirium and deconditioning.  She had a delirium workup in the hospital which ruled out any infectious causes to her confused state.  Notes indicate that she was back to her baseline state of paranoia and confusion by the time of discharge.    When seen today, she did not know where she was and had refused her morning bp meds.  She claimed she did take them, but had not.  With much encouragement from myself, unit supervisor and her nurse, she did take her bp medicine, but still refused the multivitamin.  She had no complaints.  She had some rehab this morning, but did not really recall much more about today so far.    ROS: Review of Systems  Constitutional: Negative for fever.  HENT: Positive for hearing loss. Negative for congestion.   Eyes: Negative for blurred vision.  Respiratory: Negative for shortness of breath.   Cardiovascular: Negative for chest pain and leg swelling.  Gastrointestinal: Negative for abdominal pain.  Genitourinary: Negative for dysuria, urgency and frequency.  Musculoskeletal: Negative for falls.  Skin: Negative for rash.  Neurological: Negative for dizziness and loss of consciousness.  Psychiatric/Behavioral: Positive for memory loss.       Paranoid delusions     Past Medical History  Diagnosis Date  . Hypertension   . Chronic kidney disease     kidney stones   Past  Surgical History  Procedure Laterality Date  . Abdominal hysterectomy      partial   Social History:   reports that she quit smoking about 45 years ago. Her smoking use included Cigarettes. She smoked 0.00 packs per day. She does not have any smokeless tobacco history on file. She reports that she does not drink alcohol. Her drug history is not on file.  No family history on file.  Medications: Patient's Medications  New Prescriptions   No medications on file  Previous Medications   ACETAMINOPHEN (TYLENOL) 325 MG TABLET    Take 325-650 mg by mouth every 6 (six) hours as needed for mild pain.   AMLODIPINE (NORVASC) 5 MG TABLET    Take 1 tablet (5 mg total) by mouth daily.   ASPIRIN 325 MG TABLET    Take 325 mg by mouth daily as needed for mild pain.   MULTIPLE VITAMIN (MULTIVITAMIN WITH MINERALS) TABS TABLET    Take 1 tablet by mouth daily.  Modified Medications   No medications on file  Discontinued Medications   No medications on file     Physical Exam: There were no vitals filed for this visit. Physical Exam   Labs reviewed: Basic Metabolic Panel:  Recent Labs  40/98/1107/24/15 0335 12/03/13 0350 12/04/13 0630  NA 148* 141 137  K 4.2 3.7 4.0  CL 113* 108 105  CO2 23 21 22   GLUCOSE 100* 93 103*  BUN 25* 22 15  CREATININE 1.24* 0.82 0.86  CALCIUM 8.2*  8.6 8.5   Liver Function Tests:  Recent Labs  12/01/13 1936  AST 41*  ALT 21  ALKPHOS 53  BILITOT 0.7  PROT 7.8  ALBUMIN 3.7   No results found for this basename: LIPASE, AMYLASE,  in the last 8760 hours  Recent Labs  12/03/13 0350  AMMONIA 43   CBC:  Recent Labs  12/01/13 1936 12/02/13 0335 12/03/13 0350  WBC 8.9 10.1 9.2  NEUTROABS 6.6  --   --   HGB 15.0 12.8 12.8  HCT 46.5* 40.4 39.8  MCV 89.6 90.6 88.1  PLT 219 193 196   Cardiac Enzymes:  Recent Labs  12/01/13 1936  CKTOTAL 271*  12/01/13:  CXR:  There is no pneumonia. There may be minimal subsegmental atelectasis  peripherally in the  left mid lung.  12/01/13:  CT head w/o contrast:  No acute finding. Marked cortical atrophy.   Assessment/Plan 1. HYPERTENSION -bp was above goal this am b/c she had refused her norvasc, but this was since given -cont to monitor--goal is <150/90 due to no other known conditions ie CAD or diabetes  2. Acute encephalopathy -said to have resolved with correction of hypernatremia and acute renal failure with hydration  3. Acute on chronic renal failure -resolved, f/u bmp -has baseline CKDIII  4. Alzheimer's disease -appears she is not safe to live alone based on her level of confusion -need to further assess her social situation and make sure she has adequate supervision   5. Paranoid delusion -per hospital notes, she has some of this at her baseline as part of her dementia -seems paranoid about taking her medications to some degree  6. Hyperlipidemia -by history -will need f/u lipid panel--may have some vascular component to dementia, but CT showed primarily cortical atrophy more typical for AD  Functional status:  Her daughter took her grocery shopping but patient claims otherwise to have functioned independently?  Family/ staff Communication: seen with unit supervisor  Labs/tests ordered:  Bmp, flp next draw

## 2013-12-08 LAB — CULTURE, BLOOD (ROUTINE X 2)
CULTURE: NO GROWTH
Culture: NO GROWTH

## 2014-01-06 ENCOUNTER — Non-Acute Institutional Stay (SKILLED_NURSING_FACILITY): Payer: Medicare Other | Admitting: Internal Medicine

## 2014-01-06 DIAGNOSIS — R635 Abnormal weight gain: Secondary | ICD-10-CM | POA: Diagnosis not present

## 2014-01-06 DIAGNOSIS — I10 Essential (primary) hypertension: Secondary | ICD-10-CM | POA: Diagnosis not present

## 2014-01-06 DIAGNOSIS — G309 Alzheimer's disease, unspecified: Secondary | ICD-10-CM

## 2014-01-06 DIAGNOSIS — F0280 Dementia in other diseases classified elsewhere without behavioral disturbance: Secondary | ICD-10-CM | POA: Diagnosis not present

## 2014-01-06 DIAGNOSIS — R609 Edema, unspecified: Secondary | ICD-10-CM

## 2014-01-06 DIAGNOSIS — E785 Hyperlipidemia, unspecified: Secondary | ICD-10-CM | POA: Diagnosis not present

## 2014-01-06 NOTE — Progress Notes (Signed)
Patient ID: Robin Meyers, female   DOB: 04/17/1929, 80 y.o.   MRN: [id number]    Facility: [facility]  Chief Complaint  Patient presents with  . Medical Management of Chronic Issues    weight gain   No Known Allergies  HPI 80 y.o. Female patient  with h/o dementia, paranoia, htn, CKD III, and hyperlipidemia is seen today for routine visit. She is here for STR. She denies any concerns this visit. On review of her vitals, she has gained about 10 lbs since last visit. She is working with therapy team. No concerns from staff this visit.  Review of Systems  Constitutional: Negative for fever.  HENT: Positive for hearing loss. Negative for congestion.   Eyes: Negative for blurred vision.  Respiratory: Negative for shortness of breath.   Cardiovascular: Negative for chest pain. Has noticed increased swelling in her legs Gastrointestinal: Negative for abdominal pain.  Genitourinary: Negative for dysuria, urgency and frequency.  Musculoskeletal: Negative for falls.  Skin: Negative for rash.  Neurological: Negative for dizziness and loss of consciousness.  Psychiatric/Behavioral: has memory loss  Past Medical History  Diagnosis Date  . Hypertension   . Chronic kidney disease     kidney stones   Medication reviewed. See MAR Amlodipine and namenda  Physical exam Pulse 82  Temp(Src) 97.5 F (36.4 C)  Resp 18  Ht  (1.676 m)  Wt 146 lb (66.225 kg)  BMI 23.58 kg/m2  SpO2 95% bp 188/84  General- elderly female in no acute distress Head- atraumatic, normocephalic Eyes- mild puffiness noted in periorbital area, PERRLA, EOMI, no pallor, no icterus, no discharge Cardiovascular- normal s1,s2, no murmurs Respiratory- bilateral clear to auscultation, no wheeze, no rhonchi, no crackles Abdomen- bowel sounds present, soft, non tender Musculoskeletal- able to move all 4 extremities, trace bilateral leg edema Neurological- no focal deficit Psychiatry- alert  and oriented, calm this visit  Labs 12/12/13 na 139, k 3.7, bun 13, cr 0.92, glu 94, ca 8., t.chol 143, ldl 76, hdl 52, tg 75  Assessment/plan  HYPERTENSION bp elevated today. Given her leg edema, will d/c amlodipine and have her on hctz 25 mg daily to help both with bp and leg swelling. bp check once a day and reassess bp readings in 2-3 weeks to adjust med if needed. Goal bp < 140/90  Alzheimer's disease Continue namenda xr- on titration pack. Skin care, fall precautions.   Edema Her elevated bp and being on amlodipine both could be contributing to this. Monitor clinically. Advised to keep legs elevated at rest.   Weight gain Adequate for her height but monitor for signs of heart failure given her edema. Closer monitoring of her weight  Hyperlipidemia ldl at goal on labs, monitor clinically

## 2014-03-02 ENCOUNTER — Encounter: Payer: Self-pay | Admitting: Internal Medicine

## 2014-03-02 NOTE — Progress Notes (Signed)
Patient ID: Boneta LucksMargaret W Peri, female   DOB: 09/28/1928, 80 y.o.   MRN: [id number]     [facility]  Chief complaint- discharge visit  Allergies reviewed  HPI        Y/o patient is seen today for discharge visit. Patient was here for short term rehabilitation and has worked with therapy team.     Review of system Constitutional: Negative for fever, chills HENT: Negative for congestion.  Respiratory: Negative for cough, sputum production, shortness of breath and wheezing.   Cardiovascular: Negative for chest pain, palpitations, leg swelling.  Gastrointestinal: Negative for heartburn, nausea, vomiting, abdominal pain, constipation Genitourinary: Negative for dysuria Musculoskeletal: Negative for back pain, falls Skin: Negative for itching and rash.  Neurological: Negative for dizziness, tingling, focal weakness and headaches.  Psychiatric/Behavioral: Negative for depression   [facility]. [facility]   Medication List       This list is accurate as of: 03/02/14 12:17 PM.  Always use your most recent med list.               hydrochlorothiazide 25 MG tablet  Commonly known as:  HYDRODIURIL  Take 25 mg by mouth daily.     NAMENDA XR 28 MG Cp24  Generic drug:  Memantine HCl ER  Take 1 capsule by mouth daily.         Physical exam BP 132/70  Pulse 86  Temp(Src) 98 F (36.7 C)  Resp 18  Ht 5\' 6"  (1.676 m)  Wt 145 lb (65.772 kg)  BMI 23.41 kg/m2  General- elderly female in no acute distress Head- atraumatic, normocephalic Cardiovascular- normal s1,s2, no murmurs/ rubs/ gallops Respiratory- bilateral clear to auscultation, no wheeze, no rhonchi, no crackles Abdomen- bowel sounds present, soft, non tender Musculoskeletal- able to move all 4 extremities, no use of assistive device Neurological- no focal deficit Psychiatry- alert and oriented to person, place and time, normal mood and affect Skin- warm and dry   Labs  reviewed  Assessment/plan   Home health services:  DME required:  PCP follow up: Abbey ChattersJessica Eubanks 03/23/14 9 am  30 days supply of prescription medications provided  Plan of care discussed with patient and verbalizes understanding this. Reviewed care plan with nursing staff    This encounter was created in error - please disregard.

## 2014-03-07 ENCOUNTER — Non-Acute Institutional Stay (SKILLED_NURSING_FACILITY): Payer: Medicare Other | Admitting: Internal Medicine

## 2014-03-07 ENCOUNTER — Encounter: Payer: Self-pay | Admitting: Internal Medicine

## 2014-03-07 DIAGNOSIS — G309 Alzheimer's disease, unspecified: Secondary | ICD-10-CM | POA: Diagnosis not present

## 2014-03-07 DIAGNOSIS — I10 Essential (primary) hypertension: Secondary | ICD-10-CM | POA: Diagnosis not present

## 2014-03-07 DIAGNOSIS — F0280 Dementia in other diseases classified elsewhere without behavioral disturbance: Secondary | ICD-10-CM

## 2014-03-07 NOTE — Progress Notes (Signed)
Patient ID: Robin Meyers, female   DOB: 07/15/1928, 80 y.o.   MRN: [id number]  [facility] Rylei Codispoti L. Renato Gailseed, D.O., C.M.D.  PCP: No PCP Per Patient to see Abbey ChattersJessica Eubanks, NP in our office 1-2 wks  No Known Allergies  Chief Complaint  Patient presents with  . Discharge Note    HPI:  80 yo female with h/o dementia, paranoia, htn, CKD III, and hyperlipidemia was here for short term rehab s/p hospitalization 7/23-27 with hyperthermia to 103.9, hypernatremia, acute on chronic renal failure, delirium and deconditioning. She had a delirium workup in the hospital which ruled out any infectious causes to her confused state. Notes indicate that she was back to her baseline state of paranoia and confusion by the time of discharge.  She completed her rehab and was very eager to return home.  In fact, she went home for her safety assessment and refused to return.  Her daughter is providing care for her.    Review of Systems:  Review of Systems  Constitutional: Negative for fever.  Eyes: Negative for blurred vision.  Respiratory: Negative for cough and shortness of breath.   Cardiovascular: Negative for chest pain.  Gastrointestinal: Negative for abdominal pain and constipation.  Genitourinary: Negative for dysuria.  Musculoskeletal: Negative for falls.  Neurological: Negative for dizziness and headaches.  Psychiatric/Behavioral: Positive for memory loss.     Past Medical History  Diagnosis Date  . Hypertension   . Chronic kidney disease     kidney stones    Past Surgical History  Procedure Laterality Date  . Abdominal hysterectomy      partial    Social History:   reports that she quit smoking about 45 years ago. Her smoking use included Cigarettes. She smoked 0.00 packs per day. She does not have any smokeless tobacco history on file. She reports that she does not drink alcohol. Her drug history is not on file.  No family history on  file.  Medications: Patient's Medications  New Prescriptions   No medications on file  Previous Medications   HYDROCHLOROTHIAZIDE (HYDRODIURIL) 25 MG TABLET    Take 25 mg by mouth daily.   MEMANTINE HCL ER (NAMENDA XR) 28 MG CP24    Take 1 capsule by mouth daily.  Modified Medications   No medications on file  Discontinued Medications   No medications on file    Physical Exam: Filed Vitals:   03/07/14 1527  BP: 132/70  Pulse: 86  Temp: 98 F (36.7 C)  Resp: 18  Height: 5\' 6"  (1.676 m)  Weight: 145 lb (65.772 kg)  Physical Exam  Constitutional:  Frail black female, NAD  Cardiovascular: Normal rate, regular rhythm and normal heart sounds.   Pulmonary/Chest: Effort normal and breath sounds normal.  Abdominal: Soft. Bowel sounds are normal.  Musculoskeletal: Normal range of motion.  Neurological: She is alert.  Oriented to person only  Skin: Skin is warm and dry.  Psychiatric: She has a normal mood and affect.    Labs reviewed: Basic Metabolic Panel:  Recent Labs  40/98/1107/24/15 0335 12/03/13 0350 12/04/13 0630  NA 148* 141 137  K 4.2 3.7 4.0  CL 113* 108 105  CO2 23 21 22   GLUCOSE 100* 93 103*  BUN 25* 22 15  CREATININE 1.24* 0.82 0.86  CALCIUM 8.2* 8.6 8.5   Liver Function Tests:  Recent Labs  12/01/13 1936  AST 41*  ALT 21  ALKPHOS 53  BILITOT 0.7  PROT 7.8  ALBUMIN 3.7   No results found for this basename: LIPASE, AMYLASE,  in the last 8760 hours  Recent Labs  12/03/13 0350  AMMONIA 43   CBC:  Recent Labs  12/01/13 1936 12/02/13 0335 12/03/13 0350  WBC 8.9 10.1 9.2  NEUTROABS 6.6  --   --   HGB 15.0 12.8 12.8  HCT 46.5* 40.4 39.8  MCV 89.6 90.6 88.1  PLT 219 193 196   Cardiac Enzymes:  Recent Labs  12/01/13 1936  CKTOTAL 271*    Assessment/Plan:   1. Alzheimer's disease -cont namenda XR 28mg  daily -cont 24x7 care at home  2. Essential hypertension, benign -bp at goal with hctz only -PCP should f/u her bmp to monitor her  renal function and electrolytes   Patient is being discharged with home health services:  none  Patient is being discharged with the following durable medical equipment:  No new needs.  Patient has been advised to f/u with their PCP in 1-2 weeks to bring them up to date on their rehab stay.  They were provided with a 30 day supply of scripts for prescription medications and refills must be obtained from their PCP.    Labs/tests ordered:  F/u bmp at pcp visit

## 2014-03-16 ENCOUNTER — Telehealth: Payer: Self-pay | Admitting: General Practice

## 2014-03-16 NOTE — Telephone Encounter (Signed)
A social security form was given to me by Dr. Renato Gailseed on 03/16/2014 to mail back to [employer]. Form was mailed to Social Security on 03/16/2014.

## 2014-03-16 NOTE — Telephone Encounter (Signed)
Patient must come to upcoming appointment with Robin ChattersJessica Eubanks, NP to officially establish with the practice.  Paperwork will only be completed for 30 days from the time of discharge from SNF unless she keeps this appointment.

## 2014-03-22 ENCOUNTER — Encounter: Payer: Self-pay | Admitting: *Deleted

## 2014-03-23 ENCOUNTER — Ambulatory Visit: Payer: Medicare Other | Admitting: Nurse Practitioner

## 2015-04-12 ENCOUNTER — Encounter (HOSPITAL_COMMUNITY): Payer: Self-pay | Admitting: Emergency Medicine

## 2015-04-12 ENCOUNTER — Emergency Department (INDEPENDENT_AMBULATORY_CARE_PROVIDER_SITE_OTHER)
Admission: EM | Admit: 2015-04-12 | Discharge: 2015-04-12 | Disposition: A | Discharge status: Home / Self Care | Payer: Medicare Other | Source: Home / Self Care | Attending: Family Medicine | Admitting: Family Medicine

## 2015-04-12 DIAGNOSIS — I10 Essential (primary) hypertension: Secondary | ICD-10-CM | POA: Diagnosis not present

## 2015-04-12 LAB — POCT URINALYSIS DIP (DEVICE)
Bilirubin Urine: NEGATIVE
Glucose, UA: NEGATIVE mg/dL
Ketones, ur: NEGATIVE mg/dL
NITRITE: NEGATIVE
PH: 6 (ref 5.0–8.0)
Protein, ur: NEGATIVE mg/dL
UROBILINOGEN UA: 0.2 mg/dL (ref 0.0–1.0)

## 2015-04-12 LAB — POCT I-STAT, CHEM 8
BUN: 18 mg/dL (ref 6–20)
CALCIUM ION: 1.25 mmol/L (ref 1.13–1.30)
CHLORIDE: 106 mmol/L (ref 101–111)
CREATININE: 1.2 mg/dL — AB (ref 0.44–1.00)
GLUCOSE: 107 mg/dL — AB (ref 65–99)
HCT: 47 % — ABNORMAL HIGH (ref 36.0–46.0)
Hemoglobin: 16 g/dL — ABNORMAL HIGH (ref 12.0–15.0)
Potassium: 3.8 mmol/L (ref 3.5–5.1)
SODIUM: 143 mmol/L (ref 135–145)
TCO2: 24 mmol/L (ref 0–100)

## 2015-04-12 NOTE — ED Provider Notes (Signed)
CSN: [id number]     Arrival date & time 04/12/15  1342 History   First MD Initiated Contact with Patient 04/12/15 1406     No chief complaint on file.  (Consider location/radiation/quality/duration/timing/severity/associated sxs/prior Treatment) Patient is a 81 y.o. female presenting with leg pain. The history is provided by the patient. No language interpreter was used.  Leg Pain Location:  Leg Injury: no   Leg location:  L leg and R leg Pain details:    Quality:  Aching   Radiates to:  Does not radiate   Severity:  Moderate   Onset quality:  Gradual   Timing:  Constant   Progression:  Worsening Chronicity:  New Dislocation: no   Tetanus status:  Unknown Relieved by:  Nothing Worsened by:  Nothing tried Ineffective treatments:  None tried Associated symptoms: no muscle weakness, no neck pain and no swelling   Risk factors: no concern for non-accidental trauma and no recent illness   Pt complains of pain in both legs.  Daughter reports pt has dementia.  She has noticed some increased confusion.  (slowly progressiing over months)   Past Medical History  Diagnosis Date  . Hypertension   . Dementia   . Chronic kidney disease     kidney stones, stage 3  . Heat stroke    Past Surgical History  Procedure Laterality Date  . Abdominal hysterectomy  1964    partial, Dr. Laural BenesJohnson  . Colonoscopy     Family History  Problem Relation Age of Onset  . Stroke Father   . Pneumonia Father   . Dementia Brother   . Pneumonia Son   . Multiple sclerosis Son   . Dementia Sister   . Stroke Brother   . Multiple sclerosis Daughter   . Pneumonia Daughter   . Tuberculosis Son   . Diabetes Sister   . Seizures Brother    Social History  Substance Use Topics  . Smoking status: Former Smoker    Types: Cigarettes    Quit date: 12/02/1968  . Smokeless tobacco: Not on file  . Alcohol Use: No   OB History    No data available     Review of Systems  Musculoskeletal: Negative for neck  pain.  All other systems reviewed and are negative.   Allergies  Review of patient's allergies indicates no known allergies.  Home Medications   Prior to Admission medications   Medication Sig Start Date End Date Taking? Authorizing Provider  aspirin 81 MG tablet Take 81 mg by mouth daily.    Historical Provider, MD  hydrochlorothiazide (HYDRODIURIL) 25 MG tablet Take 25 mg by mouth daily.    Historical Provider, MD  Memantine HCl ER (NAMENDA XR) 28 MG CP24 Take 1 capsule by mouth daily.    Historical Provider, MD   Meds Ordered and Administered this Visit  Medications - No data to display  BP 218/89 mmHg  Pulse 91  Temp(Src) 98 F (36.7 C) (Oral)  Resp 16  SpO2 98% No data found.   Physical Exam  Constitutional: She appears well-developed and well-nourished.  HENT:  Head: Normocephalic and atraumatic.  Right Ear: External ear normal.  Left Ear: External ear normal.  Eyes: Conjunctivae and EOM are normal. Pupils are equal, round, and reactive to light.  Neck: Normal range of motion.  Cardiovascular: Normal rate and normal heart sounds.   Pulmonary/Chest: Effort normal and breath sounds normal.  Abdominal: Soft. She exhibits no distension.  Musculoskeletal: Normal range of motion.  Neurological: She is alert.  Confused, pleasant.  Skin: Skin is warm.  Psychiatric: She has a normal mood and affect.  Nursing note and vitals reviewed.   ED Course  Procedures (including critical care time)  Labs Review Labs Reviewed  POCT I-STAT, CHEM 8 - Abnormal; Notable for the following:    Creatinine, Ser 1.20 (*)    Glucose, Bld 107 (*)    Hemoglobin 16.0 (*)    HCT 47.0 (*)    All other components within normal limits  POCT URINALYSIS DIP (DEVICE) - Abnormal; Notable for the following:    Hgb urine dipstick SMALL (*)    Leukocytes, UA TRACE (*)    All other components within normal limits    Imaging Review No results found.   Visual Acuity Review  Right Eye  Distance:   Left Eye Distance:   Bilateral Distance:    Right Eye Near:   Left Eye Near:    Bilateral Near:         MDM Pt has old ace wraps, replaced with clean ace wrap to legs.  I advised tylenol for pain.  Schedule complete PE with [facility] care.     1. Essential hypertension    An After Visit Summary was printed and given to the patient.    Lonia Skinner [place], PA-C 04/12/15 [phone]

## 2015-04-12 NOTE — ED Notes (Signed)
Daughter brings mother in with generalized body aches that started 1 month ago [facility] Confused to place, time and birth date BP elevated 218/89 91- not taking home meds Daughter states, mother has home nurse managing care

## 2015-04-12 NOTE — Discharge Instructions (Signed)
Hypertension Hypertension, commonly called high blood pressure, is when the force of blood pumping through your arteries is too strong. Your arteries are the blood vessels that carry blood from your heart throughout your body. A blood pressure reading consists of a higher number over a lower number, such as 110/72. The higher number (systolic) is the pressure inside your arteries when your heart pumps. The lower number (diastolic) is the pressure inside your arteries when your heart relaxes. Ideally you want your blood pressure below 120/80. Hypertension forces your heart to work harder to pump blood. Your arteries may become narrow or stiff. Having untreated or uncontrolled hypertension can cause heart attack, stroke, kidney disease, and other problems. RISK FACTORS Some risk factors for high blood pressure are controllable. Others are not.  Risk factors you cannot control include:   Race. You may be at higher risk if you are African American.  Age. Risk increases with age.  Gender. Men are at higher risk than women before age 45 years. After age 65, women are at higher risk than men. Risk factors you can control include:  Not getting enough exercise or physical activity.  Being overweight.  Getting too much fat, sugar, calories, or salt in your diet.  Drinking too much alcohol. SIGNS AND SYMPTOMS Hypertension does not usually cause signs or symptoms. Extremely high blood pressure (hypertensive crisis) may cause headache, anxiety, shortness of breath, and nosebleed. DIAGNOSIS To check if you have hypertension, your health care provider will measure your blood pressure while you are seated, with your arm held at the level of your heart. It should be measured at least twice using the same arm. Certain conditions can cause a difference in blood pressure between your right and left arms. A blood pressure reading that is higher than normal on one occasion does not mean that you need treatment. If  it is not clear whether you have high blood pressure, you may be asked to return on a different day to have your blood pressure checked again. Or, you may be asked to monitor your blood pressure at home for 1 or more weeks. TREATMENT Treating high blood pressure includes making lifestyle changes and possibly taking medicine. Living a healthy lifestyle can help lower high blood pressure. You may need to change some of your habits. Lifestyle changes may include:  Following the DASH diet. This diet is high in fruits, vegetables, and whole grains. It is low in salt, red meat, and added sugars.  Keep your sodium intake below 2,300 mg per day.  Getting at least 30-45 minutes of aerobic exercise at least 4 times per week.  Losing weight if necessary.  Not smoking.  Limiting alcoholic beverages.  Learning ways to reduce stress. Your health care provider may prescribe medicine if lifestyle changes are not enough to get your blood pressure under control, and if one of the following is true:  You are 18-59 years of age and your systolic blood pressure is above 140.  You are 60 years of age or older, and your systolic blood pressure is above 150.  Your diastolic blood pressure is above 90.  You have diabetes, and your systolic blood pressure is over 140 or your diastolic blood pressure is over 90.  You have kidney disease and your blood pressure is above 140/90.  You have heart disease and your blood pressure is above 140/90. Your personal target blood pressure may vary depending on your medical conditions, your age, and other factors. HOME CARE INSTRUCTIONS    Have your blood pressure rechecked as directed by your health care provider.   Take medicines only as directed by your health care provider. Follow the directions carefully. Blood pressure medicines must be taken as prescribed. The medicine does not work as well when you skip doses. Skipping doses also puts you at risk for  problems.  Do not smoke.   Monitor your blood pressure at home as directed by your health care provider. SEEK MEDICAL CARE IF:   You think you are having a reaction to medicines taken.  You have recurrent headaches or feel dizzy.  You have swelling in your ankles.  You have trouble with your vision. SEEK IMMEDIATE MEDICAL CARE IF:  You develop a severe headache or confusion.  You have unusual weakness, numbness, or feel faint.  You have severe chest or abdominal pain.  You vomit repeatedly.  You have trouble breathing. MAKE SURE YOU:   Understand these instructions.  Will watch your condition.  Will get help right away if you are not doing well or get worse.   This information is not intended to replace advice given to you by your health care provider. Make sure you discuss any questions you have with your health care provider.   Document Released: 04/28/2005 Document Revised: 09/12/2014 Document Reviewed: 02/18/2013 Elsevier Interactive Patient Education 2016 Else[employer] Inc.  

## 2015-06-15 ENCOUNTER — Telehealth: Payer: Self-pay | Admitting: Nurse Practitioner

## 2015-06-15 ENCOUNTER — Ambulatory Visit: Payer: Self-pay | Admitting: Internal Medicine

## 2015-06-18 ENCOUNTER — Ambulatory Visit: Payer: Self-pay | Admitting: Internal Medicine

## 2019-04-06 ENCOUNTER — Emergency Department (HOSPITAL_COMMUNITY): Payer: Medicare Other

## 2019-04-06 ENCOUNTER — Inpatient Hospital Stay (HOSPITAL_COMMUNITY)
Admission: EM | Admit: 2019-04-06 | Discharge: 2019-04-10 | DRG: 177 | Disposition: A | Discharge status: Home Health | Payer: Medicare Other | Attending: Internal Medicine | Admitting: Internal Medicine

## 2019-04-06 ENCOUNTER — Encounter (HOSPITAL_COMMUNITY): Payer: Self-pay | Admitting: Emergency Medicine

## 2019-04-06 ENCOUNTER — Other Ambulatory Visit: Payer: Self-pay

## 2019-04-06 DIAGNOSIS — J1289 Other viral pneumonia: Secondary | ICD-10-CM

## 2019-04-06 DIAGNOSIS — N390 Urinary tract infection, site not specified: Secondary | ICD-10-CM

## 2019-04-06 DIAGNOSIS — E869 Volume depletion, unspecified: Secondary | ICD-10-CM | POA: Diagnosis present

## 2019-04-06 DIAGNOSIS — F0391 Unspecified dementia with behavioral disturbance: Secondary | ICD-10-CM | POA: Diagnosis present

## 2019-04-06 DIAGNOSIS — Z833 Family history of diabetes mellitus: Secondary | ICD-10-CM | POA: Diagnosis not present

## 2019-04-06 DIAGNOSIS — Z7982 Long term (current) use of aspirin: Secondary | ICD-10-CM

## 2019-04-06 DIAGNOSIS — Z87891 Personal history of nicotine dependence: Secondary | ICD-10-CM | POA: Diagnosis not present

## 2019-04-06 DIAGNOSIS — Z823 Family history of stroke: Secondary | ICD-10-CM

## 2019-04-06 DIAGNOSIS — G9341 Metabolic encephalopathy: Secondary | ICD-10-CM | POA: Diagnosis present

## 2019-04-06 DIAGNOSIS — E876 Hypokalemia: Secondary | ICD-10-CM | POA: Diagnosis present

## 2019-04-06 DIAGNOSIS — N179 Acute kidney failure, unspecified: Secondary | ICD-10-CM | POA: Diagnosis not present

## 2019-04-06 DIAGNOSIS — Z820 Family history of epilepsy and other diseases of the nervous system: Secondary | ICD-10-CM | POA: Diagnosis not present

## 2019-04-06 DIAGNOSIS — I129 Hypertensive chronic kidney disease with stage 1 through stage 4 chronic kidney disease, or unspecified chronic kidney disease: Secondary | ICD-10-CM | POA: Diagnosis present

## 2019-04-06 DIAGNOSIS — U071 COVID-19: Secondary | ICD-10-CM | POA: Diagnosis present

## 2019-04-06 DIAGNOSIS — M8580 Other specified disorders of bone density and structure, unspecified site: Secondary | ICD-10-CM | POA: Diagnosis present

## 2019-04-06 DIAGNOSIS — N183 Chronic kidney disease, stage 3 unspecified: Secondary | ICD-10-CM | POA: Diagnosis present

## 2019-04-06 DIAGNOSIS — R4182 Altered mental status, unspecified: Secondary | ICD-10-CM | POA: Diagnosis not present

## 2019-04-06 DIAGNOSIS — N1832 Chronic kidney disease, stage 3b: Secondary | ICD-10-CM | POA: Diagnosis present

## 2019-04-06 LAB — COMPREHENSIVE METABOLIC PANEL
ALT: 25 U/L (ref 0–44)
AST: 42 U/L — ABNORMAL HIGH (ref 15–41)
Albumin: 3.2 g/dL — ABNORMAL LOW (ref 3.5–5.0)
Alkaline Phosphatase: 65 U/L (ref 38–126)
Anion gap: 15 (ref 5–15)
BUN: 37 mg/dL — ABNORMAL HIGH (ref 8–23)
CO2: 28 mmol/L (ref 22–32)
Calcium: 9 mg/dL (ref 8.9–10.3)
Chloride: 99 mmol/L (ref 98–111)
Creatinine, Ser: 1.27 mg/dL — ABNORMAL HIGH (ref 0.44–1.00)
GFR calc Af Amer: 44 mL/min — ABNORMAL LOW (ref >60)
GFR calc non Af Amer: 38 mL/min — ABNORMAL LOW (ref >60)
Glucose, Bld: 105 mg/dL — ABNORMAL HIGH (ref 70–99)
Potassium: 3.3 mmol/L — ABNORMAL LOW (ref 3.5–5.1)
Sodium: 142 mmol/L (ref 135–145)
Total Bilirubin: 1.5 mg/dL — ABNORMAL HIGH (ref 0.3–1.2)
Total Protein: 8.4 g/dL — ABNORMAL HIGH (ref 6.5–8.1)

## 2019-04-06 LAB — URINALYSIS, ROUTINE W REFLEX MICROSCOPIC
Bilirubin Urine: NEGATIVE
Glucose, UA: NEGATIVE mg/dL
Ketones, ur: 5 mg/dL — AB
Nitrite: NEGATIVE
Protein, ur: >=300 mg/dL — AB
Specific Gravity, Urine: 1.023 (ref 1.005–1.030)
pH: 5 (ref 5.0–8.0)

## 2019-04-06 LAB — CBC WITH DIFFERENTIAL/PLATELET
Abs Immature Granulocytes: 0.04 10*3/uL (ref 0.00–0.07)
Basophils Absolute: 0 10*3/uL (ref 0.0–0.1)
Basophils Relative: 0 %
Eosinophils Absolute: 0 10*3/uL (ref 0.0–0.5)
Eosinophils Relative: 0 %
HCT: 47 % — ABNORMAL HIGH (ref 36.0–46.0)
Hemoglobin: 15.2 g/dL — ABNORMAL HIGH (ref 12.0–15.0)
Immature Granulocytes: 0 %
Lymphocytes Relative: 13 %
Lymphs Abs: 1.5 10*3/uL (ref 0.7–4.0)
MCH: 28.5 pg (ref 26.0–34.0)
MCHC: 32.3 g/dL (ref 30.0–36.0)
MCV: 88 fL (ref 80.0–100.0)
Monocytes Absolute: 0.5 10*3/uL (ref 0.1–1.0)
Monocytes Relative: 5 %
Neutro Abs: 9.1 10*3/uL — ABNORMAL HIGH (ref 1.7–7.7)
Neutrophils Relative %: 82 %
Platelets: 300 10*3/uL (ref 150–400)
RBC: 5.34 MIL/uL — ABNORMAL HIGH (ref 3.87–5.11)
RDW: 14.1 % (ref 11.5–15.5)
WBC: 11.2 10*3/uL — ABNORMAL HIGH (ref 4.0–10.5)
nRBC: 0 % (ref 0.0–0.2)

## 2019-04-06 LAB — CBG MONITORING, ED: Glucose-Capillary: 84 mg/dL (ref 70–99)

## 2019-04-06 LAB — LACTIC ACID, PLASMA: Lactic Acid, Venous: 1.9 mmol/L (ref 0.5–1.9)

## 2019-04-06 LAB — POC SARS CORONAVIRUS 2 AG -  ED: SARS Coronavirus 2 Ag: POSITIVE — AB

## 2019-04-06 LAB — AMMONIA: Ammonia: 11 umol/L (ref 9–35)

## 2019-04-06 MED ORDER — SODIUM CHLORIDE 0.9 % IV BOLUS
1000.0000 mL | Freq: Once | INTRAVENOUS | Status: AC
  Administered 2019-04-06: 17:00:00 1000 mL via INTRAVENOUS

## 2019-04-06 MED ORDER — POTASSIUM CHLORIDE IN NACL 20-0.9 MEQ/L-% IV SOLN
INTRAVENOUS | Status: AC
  Administered 2019-04-06: 23:00:00 via INTRAVENOUS
  Filled 2019-04-06: qty 1000

## 2019-04-06 MED ORDER — SODIUM CHLORIDE 0.9 % IV SOLN
1.0000 g | Freq: Once | INTRAVENOUS | Status: AC
  Administered 2019-04-06: 1 g via INTRAVENOUS
  Filled 2019-04-06: qty 10

## 2019-04-06 NOTE — ED Triage Notes (Signed)
Arrives via EMS, family stated decreased oral intake since Saturday, increased confusion and urine with a foul smell, hx of UTI.

## 2019-04-06 NOTE — ED Provider Notes (Signed)
[facility] Provider Note   CSN: [id number] Arrival date & time: 04/06/19  1539    LEVEL 5 CAVEAT - DEMENTIA   History   Chief Complaint Chief Complaint  Patient presents with  . Altered Mental Status    HPI Robin Meyers is a 85 y.o. female.     HPI  85 year old female presents with altered mental state and possible UTI.  History is obtained from granddaughter over the phone, [employer]rystal Meyers.  The patient normally is able to eat and drink and sings and talks and walks with a walker.  Over the last 3 days she has not been eating and drinking and not walking as much.  Family has noticed darker urine and foul smell.  No fevers, vomiting.  However the patient has had a cough during the last 3 days as well.  The patient's daughter, Robin Meyers, is the caregiver and was recently diagnosed with Covid.  She has been taking care of her with the mask.  Past Medical History:  Diagnosis Date  . Chronic kidney disease    kidney stones, stage 3  . Dementia (HCC)   . Heat stroke   . Hypertension     Patient Active Problem List   Diagnosis Date Noted  . Pneumonia due to severe acute respiratory syndrome coronavirus 2 (SARS-CoV-2) 04/06/2019  . Alzheimer's disease (HCC) 12/06/2013  . Paranoid delusion (HCC) 12/06/2013  . Hyperlipidemia 12/06/2013  . Acute on chronic renal failure (HCC) 12/02/2013  . Acute encephalopathy 12/02/2013  . Fever, unspecified 12/02/2013  . Hyperthermia 12/01/2013  . AKI (acute kidney injury) (HCC) 12/01/2013  . ANXIETY 02/27/2006  . HYPERTENSION 02/27/2006  . VARICOSE VEIN 02/27/2006  . GERD 02/27/2006  . TUBAL PREGNANCY 02/27/2006  . Osteoarthrosis, unspecified whether generalized or localized, unspecified site 02/27/2006  . OSTEOPENIA 02/27/2006  . NEPHROLITHIASIS, HX OF 02/27/2006  . SHINGLES, HX OF 02/27/2006    Past Surgical History:  Procedure Laterality Date  . ABDOMINAL HYSTERECTOMY  1964   partial, Dr.  Laural BenesJohnson  . COLONOSCOPY       OB History   No obstetric history on file.      Home Medications    Prior to Admission medications   Medication Sig Start Date End Date Taking? Authorizing Provider  amLODipine (NORVASC) 10 MG tablet Take 10 mg by mouth daily.   Yes [provider]  Ascorbic Acid (VITAMIN C PO) Take 1 tablet by mouth daily.   Yes [provider]  chlorthalidone (HYGROTON) 50 MG tablet Take 25 mg by mouth daily.   Yes [provider]  Cholecalciferol (VITAMIN D3) 10 MCG (400 UNIT) tablet Take 400 Units by mouth daily.   Yes [provider]  Multiple Vitamin (MULTIVITAMIN WITH MINERALS) TABS tablet Take 1 tablet by mouth daily.   Yes [provider]  QUEtiapine (SEROQUEL) 50 MG tablet Take 50 mg by mouth 2 (two) times daily. 03/10/19  Yes [provider]    Family History Family History  Problem Relation Age of Onset  . Stroke Father   . Pneumonia Father   . Dementia Brother   . Pneumonia Son   . Multiple sclerosis Son   . Dementia Sister   . Stroke Brother   . Multiple sclerosis Daughter   . Pneumonia Daughter   . Tuberculosis Son   . Diabetes Sister   . Seizures Brother     Social History Social History   Tobacco Use  . Smoking status: Former  Smoker    Types: Cigarettes    Quit date: 12/02/1968    Years since quitting: 50.3  Substance Use Topics  . Alcohol use: No  . Drug use: Not on file     Allergies   Patient has no known allergies.   Review of Systems Review of Systems  Unable to perform ROS: Dementia     Physical Exam Updated Vital Signs BP (!) 104/53   Pulse 78   Temp 98.6 F (37 C) (Rectal)   Resp (!) 38   SpO2 94%   Physical Exam Vitals signs and nursing note reviewed.  Constitutional:      Appearance: She is well-developed.  HENT:     Head: Normocephalic and atraumatic.     Right Ear: External ear normal.     Left Ear: External ear normal.     Nose: Nose normal.   Eyes:     General:        Right eye: No discharge.        Left eye: No discharge.  Cardiovascular:     Rate and Rhythm: Normal rate and regular rhythm.     Heart sounds: Normal heart sounds.  Pulmonary:     Effort: Pulmonary effort is normal.     Breath sounds: Normal breath sounds.  Abdominal:     General: There is no distension.     Palpations: Abdomen is soft.  Skin:    General: Skin is warm and dry.  Neurological:     Mental Status: She is alert.     Comments: Awake, responds to name. Moves all 4 extremities but does not follow commands  Psychiatric:        Mood and Affect: Mood is not anxious.      ED Treatments / Results  Labs (all labs ordered are listed, but only abnormal results are displayed) Labs Reviewed  URINALYSIS, ROUTINE W REFLEX MICROSCOPIC - Abnormal; Notable for the following components:      Result Value   Color, Urine AMBER (*)    APPearance HAZY (*)    Hgb urine dipstick MODERATE (*)    Ketones, ur 5 (*)    Protein, ur >=300 (*)    Leukocytes,Ua TRACE (*)    Bacteria, UA MANY (*)    All other components within normal limits  COMPREHENSIVE METABOLIC PANEL - Abnormal; Notable for the following components:   Potassium 3.3 (*)    Glucose, Bld 105 (*)    BUN 37 (*)    Creatinine, Ser 1.27 (*)    Total Protein 8.4 (*)    Albumin 3.2 (*)    AST 42 (*)    Total Bilirubin 1.5 (*)    GFR calc non Af Amer 38 (*)    GFR calc Af Amer 44 (*)    All other components within normal limits  CBC WITH DIFFERENTIAL/PLATELET - Abnormal; Notable for the following components:   WBC 11.2 (*)    RBC 5.34 (*)    Hemoglobin 15.2 (*)    HCT 47.0 (*)    Neutro Abs 9.1 (*)    All other components within normal limits  POC SARS CORONAVIRUS 2 AG -  ED - Abnormal; Notable for the following components:   SARS Coronavirus 2 Ag POSITIVE (*)    All other components within normal limits  URINE CULTURE  LACTIC ACID, PLASMA  AMMONIA  CBG MONITORING, ED    EKG None   Radiology Ct Head Wo Contrast  Result Date: 04/06/2019  CLINICAL DATA:  Confusion/altered mental status, dementia EXAM: CT HEAD WITHOUT CONTRAST TECHNIQUE: Contiguous axial images were obtained from the base of the skull through the vertex without intravenous contrast. COMPARISON:  12/02/2013 FINDINGS: Motion degraded images. Brain: No evidence of acute infarction, hemorrhage, hydrocephalus, extra-axial collection or mass lesion/mass effect. Global cortical and central atrophy. Secondary mild ventricular prominence. Subcortical Meyers matter and periventricular small vessel ischemic changes. Left basal ganglia lacunar infarct. Vascular: Intracranial atherosclerosis. Skull: Normal. Negative for fracture or focal lesion. Sinuses/Orbits: The visualized paranasal sinuses are essentially clear. The mastoid air cells are unopacified. Other: None. IMPRESSION: Motion degraded images. No evidence of acute intracranial abnormality. Atrophy with small vessel ischemic changes. Left basal ganglia lacunar infarct. Electronically Signed   By: Julian Hy M.D.   On: 04/06/2019 18:18   Dg Chest Portable 1 View  Result Date: 04/06/2019 CLINICAL DATA:  COVID positive, altered mental status, history of UTI EXAM: PORTABLE CHEST 1 VIEW COMPARISON:  12/01/2013 FINDINGS: Lingular and bilateral lower lobe opacities, atelectasis versus pneumonia. No frank interstitial edema. No pleural effusion or pneumothorax. The heart is normal in size.  Thoracic aortic atherosclerosis. IMPRESSION: Lingular and bilateral lower lobe opacities, atelectasis versus pneumonia. Thoracic aortic atherosclerosis. Electronically Signed   By: Julian Hy M.D.   On: 04/06/2019 18:15    Procedures Procedures (including critical care time)  Medications Ordered in ED Medications  sodium chloride 0.9 % bolus 1,000 mL (0 mLs Intravenous Stopped 04/06/19 1914)  cefTRIAXone (ROCEPHIN) 1 g in sodium chloride 0.9 % 100 mL IVPB (0 g Intravenous  Stopped 04/06/19 1914)     Initial Impression / Assessment and Plan / ED Course  I have reviewed the triage vital signs and the nursing notes.  Pertinent labs & imaging results that were available during my care of the patient were reviewed by me and considered in my medical decision making (see chart for details).        Patient is more altered compared to her baseline.  This is probably more likely UTI rather than the Covid-19 infection found while she is here.  However given this altered mental state and poor p.o. intake, she will need admission for IV antibiotics.  I discussed this with daughter over the phone.  Daughter notes she is full code.  Robin Meyers was evaluated in Emergency Department on 04/06/2019 for the symptoms described in the history of present illness. She was evaluated in the context of the global COVID-19 pandemic, which necessitated consideration that the patient might be at risk for infection with the SARS-CoV-2 virus that causes COVID-19. Institutional protocols and algorithms that pertain to the evaluation of patients at risk for COVID-19 are in a state of rapid change based on information released by regulatory bodies including the CDC and federal and state organizations. These policies and algorithms were followed during the patient's care in the ED.   Final Clinical Impressions(s) / ED Diagnoses   Final diagnoses:  COVID-19 virus infection  Acute UTI    ED Discharge Orders    None       Sherwood Gambler, MD 04/06/19 2002

## 2019-04-06 NOTE — H&P (Signed)
History and Physical  Robin Meyers ZOX:[id number] DOB: 08/19/1928 DOA: 04/06/2019  Referring physician: ER provider PCP: Sharon SellerEubanks, Jessica K, NP  Outpatient Specialists:    Patient coming from: Home  Chief Complaint: Worsening confusion poor p.o. intake.  HPI: Patient is an 85 year old female with past medical history significant for dementia, hypertension and chronic kidney disease stage IIIb.  Patient's main care is the daughter, whom as per collateral information was said to have had recent COVID-19 infection.  Patient presents with 4-day history of worsening confusion and poor p.o. intake.  On presentation to the hospital, patient has been afebrile, but has been noted to have nonproductive cough.  UA reveals likely UTI.  Chest x-ray revealed lingular and bilateral lower lobe opacities, concerning for possible atelectasis versus pneumonia.  CT head without contrast has not revealed any acute process.  COVID-19 test has come back positive.  Patient stable to give any significant history.  Hospitalist team has been asked to admit patient for further assessment and management.  ED Course: On presentation to the ER, temperature was 98.6, blood pressure 114/53, respiratory rate of 17, heart rate of 78 with O2 sat of 94%.  ER provider has administered 1 L of fluid bolus and IV Rocephin.  Basic lab work was sent, and hospitalist team has been asked to admit patient.  Pertinent labs: Chemistry reveals sodium of 142, potassium of 3.3, chloride 99, CO2 28, BUN of 37, creatinine of 1.27, blood sugar of 105.  Albumin is 3.2, AST of 42, ALT of 25, ammonia of 11, total bilirubin of 1.5.  Lactic acid is 1.9.  CBC reveals WBC of 11.2, hemoglobin of 15.2, hematocrit of 47, MCV of 88 with platelet count of 300.  COVID-19 test came back positive.  CT head without contrast has not revealed any acute changes.  Imaging: independently reviewed.  Chest x-ray revealed lingular and bilateral lower lobe opacities,  concerning for atelectasis versus pneumonia.  Review of Systems:  Unobtainable.  Past Medical History:  Diagnosis Date   Chronic kidney disease    kidney stones, stage 3   Dementia (HCC)    Heat stroke    Hypertension     Past Surgical History:  Procedure Laterality Date   ABDOMINAL HYSTERECTOMY  1964   partial, Dr. Laural BenesJohnson   COLONOSCOPY       reports that she quit smoking about 50 years ago. Her smoking use included cigarettes. She does not have any smokeless tobacco history on file. She reports that she does not drink alcohol. No history on file for drug.  No Known Allergies  Family History  Problem Relation Age of Onset   Stroke Father    Pneumonia Father    Dementia Brother    Pneumonia Son    Multiple sclerosis Son    Dementia Sister    Stroke Brother    Multiple sclerosis Daughter    Pneumonia Daughter    Tuberculosis Son    Diabetes Sister    Seizures Brother      Prior to Admission medications   Medication Sig Start Date End Date Taking? Authorizing Provider  aspirin 81 MG tablet Take 81 mg by mouth daily.    [provider]  hydrochlorothiazide (HYDRODIURIL) 25 MG tablet Take 25 mg by mouth daily.    [provider]  Memantine HCl ER (NAMENDA XR) 28 MG CP24 Take 1 capsule by mouth daily.    [provider]    Physical Exam: Vitals:   04/06/19 1700 04/06/19  1730 04/06/19 1800 04/06/19 1900  BP: 135/65 138/72 125/69 (!) 121/55  Pulse: 80 79 81 74  Resp: (!) 24 (!) 22 (!) 32 (!) 28  Temp:      TempSrc:      SpO2: 95% 92% 95% 98%    Constitutional:   Appears calm and comfortable. Eyes:   No pallor. No jaundice.  ENMT:   external ears, nose appear normal Neck:   Neck is supple. No JVD Respiratory:   Decreased air entry  Respiratory effort normal. No retractions or accessory muscle use Cardiovascular:   S1S2  No LE extremity edema   Abdomen:   Abdomen is soft and non tender. Organs are  difficult to assess. Neurologic:   Awake.    Patient will not cooperate with full neurological exam.    Wt Readings from Last 3 Encounters:  03/07/14 65.8 kg  03/02/14 65.8 kg  01/06/14 66.2 kg    I have personally reviewed following labs and imaging studies  Labs on Admission:  CBC: Recent Labs  Lab 04/06/19 1709  WBC 11.2*  NEUTROABS 9.1*  HGB 15.2*  HCT 47.0*  MCV 88.0  PLT 627   Basic Metabolic Panel: Recent Labs  Lab 04/06/19 1709  NA 142  K 3.3*  CL 99  CO2 28  GLUCOSE 105*  BUN 37*  CREATININE 1.27*  CALCIUM 9.0   Liver Function Tests: Recent Labs  Lab 04/06/19 1709  AST 42*  ALT 25  ALKPHOS 65  BILITOT 1.5*  PROT 8.4*  ALBUMIN 3.2*   No results for input(s): LIPASE, AMYLASE in the last 168 hours. Recent Labs  Lab 04/06/19 1709  AMMONIA 11   Coagulation Profile: No results for input(s): INR, PROTIME in the last 168 hours. Cardiac Enzymes: No results for input(s): CKTOTAL, CKMB, CKMBINDEX, TROPONINI in the last 168 hours. BNP (last 3 results) No results for input(s): PROBNP in the last 8760 hours. HbA1C: No results for input(s): HGBA1C in the last 72 hours. CBG: Recent Labs  Lab 04/06/19 1708  GLUCAP 84   Lipid Profile: No results for input(s): CHOL, HDL, LDLCALC, TRIG, CHOLHDL, LDLDIRECT in the last 72 hours. Thyroid Function Tests: No results for input(s): TSH, T4TOTAL, FREET4, T3FREE, THYROIDAB in the last 72 hours. Anemia Panel: No results for input(s): VITAMINB12, FOLATE, FERRITIN, TIBC, IRON, RETICCTPCT in the last 72 hours. Urine analysis:    Component Value Date/Time   COLORURINE AMBER (A) 04/06/2019 1700   APPEARANCEUR HAZY (A) 04/06/2019 1700   LABSPEC 1.023 04/06/2019 1700   PHURINE 5.0 04/06/2019 1700   GLUCOSEU NEGATIVE 04/06/2019 1700   HGBUR MODERATE (A) 04/06/2019 1700   BILIRUBINUR NEGATIVE 04/06/2019 1700   KETONESUR 5 (A) 04/06/2019 1700   PROTEINUR >=300 (A) 04/06/2019 1700   UROBILINOGEN 0.2 04/12/2015  1521   NITRITE NEGATIVE 04/06/2019 1700   LEUKOCYTESUR TRACE (A) 04/06/2019 1700   Sepsis Labs: @LABRCNTIP (procalcitonin:4,lacticidven:4) )No results found for this or any previous visit (from the past 240 hour(s)).    Radiological Exams on Admission: Ct Head Wo Contrast  Result Date: 04/06/2019 CLINICAL DATA:  Confusion/altered mental status, dementia EXAM: CT HEAD WITHOUT CONTRAST TECHNIQUE: Contiguous axial images were obtained from the base of the skull through the vertex without intravenous contrast. COMPARISON:  12/02/2013 FINDINGS: Motion degraded images. Brain: No evidence of acute infarction, hemorrhage, hydrocephalus, extra-axial collection or mass lesion/mass effect. Global cortical and central atrophy. Secondary mild ventricular prominence. Subcortical white matter and periventricular small vessel ischemic changes. Left basal ganglia lacunar infarct. Vascular:  Intracranial atherosclerosis. Skull: Normal. Negative for fracture or focal lesion. Sinuses/Orbits: The visualized paranasal sinuses are essentially clear. The mastoid air cells are unopacified. Other: None. IMPRESSION: Motion degraded images. No evidence of acute intracranial abnormality. Atrophy with small vessel ischemic changes. Left basal ganglia lacunar infarct. Electronically Signed   By: Charline Bills M.D.   On: 04/06/2019 18:18   Dg Chest Portable 1 View  Result Date: 04/06/2019 CLINICAL DATA:  COVID positive, altered mental status, history of UTI EXAM: PORTABLE CHEST 1 VIEW COMPARISON:  12/01/2013 FINDINGS: Lingular and bilateral lower lobe opacities, atelectasis versus pneumonia. No frank interstitial edema. No pleural effusion or pneumothorax. The heart is normal in size.  Thoracic aortic atherosclerosis. IMPRESSION: Lingular and bilateral lower lobe opacities, atelectasis versus pneumonia. Thoracic aortic atherosclerosis. Electronically Signed   By: Charline Bills M.D.   On: 04/06/2019 18:15    Active  Problems:   * No active hospital problems. *   Assessment/Plan Acute encephalopathy: -Likely multifactorial, including toxic and metabolic. -Patient has likely UTI. -Patient is also positive for COVID-19, with chest x-ray changes -Patient is also volume depleted -Admit patient for further assessment and management -Cautious hydration, considering Covid infection -Panculture patient -IV antibiotics -Further management depend on hospital course  COVID-19 infection/pneumonia: -Cannot rule out associated pneumonia secondary to COVID-19. -Oral steroids -Low threshold to add remdesivir, depending on clinical situation on hospital course -Supportive care -Check inflammatory markers daily -We will admit patient to [facility]  Volume depletion: Hydrate patient cautiously  Chronic kidney disease stage IIIb: Serum creatinine may not be too far from the baseline Continue to monitor closely Monitor electrolytes  Hypokalemia: Potassium is 3.3 Monitor and replete  Hypertension: Controlled Continue to monitor closely  Dementia: Continue supportive care No behavioral problems  Further management depend on hospital course  DVT prophylaxis: Subcutaneous heparin Code Status: To be determined Family Communication:  Disposition Plan: This will depend on hospital course Consults called: None Admission status: Inpatient  Time spent: 65 minutes  Berton Mount, MD  [facility] Pager #: [phone] 7PM-7AM contact night coverage as above  04/06/2019, 7:25 PM

## 2019-04-07 ENCOUNTER — Encounter (HOSPITAL_COMMUNITY): Payer: Self-pay | Admitting: *Deleted

## 2019-04-07 DIAGNOSIS — U071 COVID-19: Secondary | ICD-10-CM | POA: Diagnosis present

## 2019-04-07 DIAGNOSIS — N183 Chronic kidney disease, stage 3 unspecified: Secondary | ICD-10-CM | POA: Diagnosis present

## 2019-04-07 DIAGNOSIS — G9341 Metabolic encephalopathy: Secondary | ICD-10-CM

## 2019-04-07 LAB — COMPREHENSIVE METABOLIC PANEL
ALT: 21 U/L (ref 0–44)
AST: 38 U/L (ref 15–41)
Albumin: 2.8 g/dL — ABNORMAL LOW (ref 3.5–5.0)
Alkaline Phosphatase: 61 U/L (ref 38–126)
Anion gap: 13 (ref 5–15)
BUN: 32 mg/dL — ABNORMAL HIGH (ref 8–23)
CO2: 23 mmol/L (ref 22–32)
Calcium: 8.6 mg/dL — ABNORMAL LOW (ref 8.9–10.3)
Chloride: 107 mmol/L (ref 98–111)
Creatinine, Ser: 1.25 mg/dL — ABNORMAL HIGH (ref 0.44–1.00)
GFR calc Af Amer: 44 mL/min — ABNORMAL LOW (ref >60)
GFR calc non Af Amer: 38 mL/min — ABNORMAL LOW (ref >60)
Glucose, Bld: 92 mg/dL (ref 70–99)
Potassium: 3.8 mmol/L (ref 3.5–5.1)
Sodium: 143 mmol/L (ref 135–145)
Total Bilirubin: 1.1 mg/dL (ref 0.3–1.2)
Total Protein: 7.6 g/dL (ref 6.5–8.1)

## 2019-04-07 LAB — INFLUENZA PANEL BY PCR (TYPE A & B)
Influenza A By PCR: NEGATIVE
Influenza B By PCR: NEGATIVE

## 2019-04-07 LAB — RESPIRATORY PANEL BY PCR

## 2019-04-07 LAB — CBC WITH DIFFERENTIAL/PLATELET
Abs Immature Granulocytes: 0.03 10*3/uL (ref 0.00–0.07)
Basophils Absolute: 0 10*3/uL (ref 0.0–0.1)
Basophils Relative: 0 %
Eosinophils Absolute: 0 10*3/uL (ref 0.0–0.5)
Eosinophils Relative: 0 %
HCT: 47.8 % — ABNORMAL HIGH (ref 36.0–46.0)
Hemoglobin: 14.6 g/dL (ref 12.0–15.0)
Immature Granulocytes: 0 %
Lymphocytes Relative: 14 %
Lymphs Abs: 1.2 10*3/uL (ref 0.7–4.0)
MCH: 27.7 pg (ref 26.0–34.0)
MCHC: 30.5 g/dL (ref 30.0–36.0)
MCV: 90.5 fL (ref 80.0–100.0)
Monocytes Absolute: 0.4 10*3/uL (ref 0.1–1.0)
Monocytes Relative: 4 %
Neutro Abs: 7.2 10*3/uL (ref 1.7–7.7)
Neutrophils Relative %: 82 %
Platelets: 296 10*3/uL (ref 150–400)
RBC: 5.28 MIL/uL — ABNORMAL HIGH (ref 3.87–5.11)
RDW: 14.1 % (ref 11.5–15.5)
WBC: 8.9 10*3/uL (ref 4.0–10.5)
nRBC: 0 % (ref 0.0–0.2)

## 2019-04-07 LAB — URINE CULTURE: Culture: NO GROWTH

## 2019-04-07 LAB — PHOSPHORUS: Phosphorus: 2.9 mg/dL (ref 2.5–4.6)

## 2019-04-07 LAB — ABO/RH: ABO/RH(D): A POS

## 2019-04-07 LAB — MAGNESIUM: Magnesium: 2.4 mg/dL (ref 1.7–2.4)

## 2019-04-07 LAB — D-DIMER, QUANTITATIVE: D-Dimer, Quant: 2.3 ug/mL-FEU — ABNORMAL HIGH (ref 0.00–0.50)

## 2019-04-07 LAB — C-REACTIVE PROTEIN: CRP: 13.5 mg/dL — ABNORMAL HIGH (ref <1.0)

## 2019-04-07 LAB — FERRITIN: Ferritin: 221 ng/mL (ref 11–307)

## 2019-04-07 MED ORDER — POTASSIUM CHLORIDE CRYS ER 20 MEQ PO TBCR
40.0000 meq | EXTENDED_RELEASE_TABLET | Freq: Once | ORAL | Status: DC
  Filled 2019-04-07: qty 2

## 2019-04-07 MED ORDER — QUETIAPINE FUMARATE 50 MG PO TABS
50.0000 mg | ORAL_TABLET | Freq: Two times a day (BID) | ORAL | Status: DC
  Administered 2019-04-07 – 2019-04-10 (×4): 50 mg via ORAL
  Filled 2019-04-07 (×7): qty 1

## 2019-04-07 MED ORDER — CHOLECALCIFEROL 10 MCG (400 UNIT) PO TABS
400.0000 [IU] | ORAL_TABLET | Freq: Every day | ORAL | Status: DC
  Administered 2019-04-07 – 2019-04-10 (×3): 400 [IU] via ORAL
  Filled 2019-04-07 (×4): qty 1

## 2019-04-07 MED ORDER — DEXAMETHASONE 4 MG PO TABS
6.0000 mg | ORAL_TABLET | Freq: Every day | ORAL | Status: DC
  Administered 2019-04-07 – 2019-04-10 (×3): 6 mg via ORAL
  Filled 2019-04-07 (×4): qty 2

## 2019-04-07 MED ORDER — SODIUM CHLORIDE 0.9 % IV SOLN
1.0000 g | INTRAVENOUS | Status: DC
  Administered 2019-04-07 – 2019-04-08 (×2): 1 g via INTRAVENOUS
  Filled 2019-04-07: qty 1
  Filled 2019-04-07 (×2): qty 10

## 2019-04-07 MED ORDER — HALOPERIDOL LACTATE 5 MG/ML IJ SOLN
2.0000 mg | Freq: Four times a day (QID) | INTRAMUSCULAR | Status: DC | PRN
  Administered 2019-04-07 – 2019-04-09 (×3): 2 mg via INTRAVENOUS
  Filled 2019-04-07 (×4): qty 1

## 2019-04-07 MED ORDER — HALOPERIDOL LACTATE 5 MG/ML IJ SOLN
2.0000 mg | Freq: Once | INTRAMUSCULAR | Status: AC
  Administered 2019-04-07: 2 mg via INTRAVENOUS
  Filled 2019-04-07: qty 1

## 2019-04-07 MED ORDER — ZINC SULFATE 220 (50 ZN) MG PO CAPS
220.0000 mg | ORAL_CAPSULE | Freq: Every day | ORAL | Status: DC
  Administered 2019-04-09 – 2019-04-10 (×2): 220 mg via ORAL
  Filled 2019-04-07 (×4): qty 1

## 2019-04-07 MED ORDER — POTASSIUM CHLORIDE IN NACL 20-0.9 MEQ/L-% IV SOLN
INTRAVENOUS | Status: DC
  Administered 2019-04-07: 21:00:00 via INTRAVENOUS
  Filled 2019-04-07: qty 1000

## 2019-04-07 MED ORDER — ADULT MULTIVITAMIN W/MINERALS CH
1.0000 | ORAL_TABLET | Freq: Every day | ORAL | Status: DC
  Administered 2019-04-09 – 2019-04-10 (×2): 1 via ORAL
  Filled 2019-04-07 (×4): qty 1

## 2019-04-07 MED ORDER — VITAMIN C 500 MG PO TABS
500.0000 mg | ORAL_TABLET | Freq: Every day | ORAL | Status: DC
  Administered 2019-04-07 – 2019-04-10 (×3): 500 mg via ORAL
  Filled 2019-04-07 (×4): qty 1

## 2019-04-07 MED ORDER — HEPARIN SODIUM (PORCINE) 5000 UNIT/ML IJ SOLN
5000.0000 [IU] | Freq: Three times a day (TID) | INTRAMUSCULAR | Status: DC
  Administered 2019-04-07 – 2019-04-10 (×8): 5000 [IU] via SUBCUTANEOUS
  Filled 2019-04-07 (×10): qty 1

## 2019-04-07 NOTE — Progress Notes (Signed)
Pt arrived to unit, combative and agitated with patient care. Pulling telemetry leads off, climbing out of bed and unable to reorient. Provider made aware and orders received for haldol.

## 2019-04-07 NOTE — Progress Notes (Signed)
Previous Rn states that daughter called and was expressing concerns about food intake and getting nutrients.   Pt has not been able to swallow pills well r/t mouth pocketing on current shift.PO tabs were crushed and given with applesauce.   Continue to monitor and/or consult may be needed.

## 2019-04-07 NOTE — Progress Notes (Signed)
PROGRESS NOTE    Robin Meyers  QQV:[id number] DOB: 1928/10/07 DOA: 04/06/2019 PCP: Sharon Seller, NP    Brief Narrative:  Patient is 85 year old female with history of dementia, hypertension, CKD stage IIIb who lives at home with his daughter brought to the emergency room with 4-day history of worsening confusion and poor oral intake.  In the emergency room, UA was consistent with acute UTI.  Chest x-ray revealed bilateral lower lobe opacities.  CT head normal.  COVID-19 positive.  Admitted with COVID-19 infection as well UTI.   Assessment & Plan:   Principal Problem:   Acute metabolic encephalopathy Active Problems:   Pneumonia due to COVID-19 virus   CKD (chronic kidney disease), stage III  Acute UTI present on admission: Remains on Rocephin.  Cultures pending.  Continue.  Acute encephalopathy in the setting of underlying severe dementia: CT head negative.  No focal neurological deficits.  Probably progressive dementia aggravated by acute infection. Fall precautions.  Delirium precautions.  Will use Haldol as needed to help prevent falling and pulling on lines.  She is on Seroquel at home that she will continue.  CKD stage III: Renal function is at about her baseline.  Pneumonia due to COVID-19 virus: Patient is poor historian.  Does not have much pulmonary symptoms.  She is on room air.  Started on steroids.  Close monitoring.  Breathing treatments.   DVT prophylaxis: Heparin subcu Code Status: Full code, confirmed with daughter Family Communication: Patient's daughter called and updated.  Disposition Plan: Anticipate discharge back home with patient's daughter when he stabilizes.   Consultants:   None  Procedures:   None  Antimicrobials:   Rocephin, 04/06/2019----   Subjective: Patient was seen and examined.  Poor historian.  Agitated, pulling on lines and pure wick.  Does not offer any complaints. Remains afebrile.  94% on room air.  Not in any obvious  distress. Daughter reported that her main problem was not eating for the last 4 days.  Objective: Vitals:   04/06/19 2330 04/07/19 0000 04/07/19 0139 04/07/19 0557  BP: (!) 127/56 (!) 130/97 122/89 (!) 143/66  Pulse: 70 83 82 91  Resp: (!) 30 (!) 26 (!) 22 20  Temp:   99.5 F (37.5 C) 99.4 F (37.4 C)  TempSrc:   Axillary Axillary  SpO2: 93% 92% 94% 95%    Intake/Output Summary (Last 24 hours) at 04/07/2019 1125 Last data filed at 04/07/2019 0600 Gross per 24 hour  Intake 1100 ml  Output 130 ml  Net 970 ml   There were no vitals filed for this visit.  Examination:  General exam: Pleasantly confused, agitated, pulling on lines.  On room air. Respiratory system: Clear to auscultation. Respiratory effort normal.  No added sounds. Cardiovascular system: S1 & S2 heard, RRR. No JVD, murmurs, rubs, gallops or clicks. No pedal edema. Gastrointestinal system: Abdomen is nondistended, soft and nontender. No organomegaly or masses felt. Normal bowel sounds heard. Central nervous system: Alert, confused, not following commands. Extremities: Moving all extremities. Skin: No rashes, lesions or ulcers Psychiatry: Judgement and insight appear compromised.       Data Reviewed: I have personally reviewed following labs and imaging studies  CBC: Recent Labs  Lab 04/06/19 1709 04/07/19 0448  WBC 11.2* 8.9  NEUTROABS 9.1* 7.2  HGB 15.2* 14.6  HCT 47.0* 47.8*  MCV 88.0 90.5  PLT 300 296   Basic Metabolic Panel: Recent Labs  Lab 04/06/19 1709 04/07/19 0448  NA 142 143  K  3.3* 3.8  CL 99 107  CO2 28 23  GLUCOSE 105* 92  BUN 37* 32*  CREATININE 1.27* 1.25*  CALCIUM 9.0 8.6*  MG  --  2.4  PHOS  --  2.9   GFR: CrCl cannot be calculated (Unknown ideal weight.). Liver Function Tests: Recent Labs  Lab 04/06/19 1709 04/07/19 0448  AST 42* 38  ALT 25 21  ALKPHOS 65 61  BILITOT 1.5* 1.1  PROT 8.4* 7.6  ALBUMIN 3.2* 2.8*   No results for input(s): LIPASE, AMYLASE in  the last 168 hours. Recent Labs  Lab 04/06/19 1709  AMMONIA 11   Coagulation Profile: No results for input(s): INR, PROTIME in the last 168 hours. Cardiac Enzymes: No results for input(s): CKTOTAL, CKMB, CKMBINDEX, TROPONINI in the last 168 hours. BNP (last 3 results) No results for input(s): PROBNP in the last 8760 hours. HbA1C: No results for input(s): HGBA1C in the last 72 hours. CBG: Recent Labs  Lab 04/06/19 1708  GLUCAP 84   Lipid Profile: No results for input(s): CHOL, HDL, LDLCALC, TRIG, CHOLHDL, LDLDIRECT in the last 72 hours. Thyroid Function Tests: No results for input(s): TSH, T4TOTAL, FREET4, T3FREE, THYROIDAB in the last 72 hours. Anemia Panel: Recent Labs    04/07/19 0448  FERRITIN 221   Sepsis Labs: Recent Labs  Lab 04/06/19 1709  LATICACIDVEN 1.9    Recent Results (from the past 240 hour(s))  Respiratory Panel by PCR     Status: None   Collection Time: 04/07/19  1:44 AM   Specimen: Nasopharyngeal Swab; Respiratory  Result Value Ref Range Status   Adenovirus NOT DETECTED NOT DETECTED Final   Coronavirus 229E NOT DETECTED NOT DETECTED Final    Comment: (NOTE) The Coronavirus on the Respiratory Panel, DOES NOT test for the novel  Coronavirus (2019 nCoV)    Coronavirus HKU1 NOT DETECTED NOT DETECTED Final   Coronavirus NL63 NOT DETECTED NOT DETECTED Final   Coronavirus OC43 NOT DETECTED NOT DETECTED Final   Metapneumovirus NOT DETECTED NOT DETECTED Final   Rhinovirus / Enterovirus NOT DETECTED NOT DETECTED Final   Influenza A NOT DETECTED NOT DETECTED Final   Influenza B NOT DETECTED NOT DETECTED Final   Parainfluenza Virus 1 NOT DETECTED NOT DETECTED Final   Parainfluenza Virus 2 NOT DETECTED NOT DETECTED Final   Parainfluenza Virus 3 NOT DETECTED NOT DETECTED Final   Parainfluenza Virus 4 NOT DETECTED NOT DETECTED Final   Respiratory Syncytial Virus NOT DETECTED NOT DETECTED Final   Bordetella pertussis NOT DETECTED NOT DETECTED Final    Chlamydophila pneumoniae NOT DETECTED NOT DETECTED Final   Mycoplasma pneumoniae NOT DETECTED NOT DETECTED Final    Comment: Performed at [facility], [place] [address], [place],  [address]         Radiology Studies: Ct Head Wo Contrast  Result Date: 04/06/2019 CLINICAL DATA:  Confusion/altered mental status, dementia EXAM: CT HEAD WITHOUT CONTRAST TECHNIQUE: Contiguous axial images were obtained from the base of the skull through the vertex without intravenous contrast. COMPARISON:  12/02/2013 FINDINGS: Motion degraded images. Brain: No evidence of acute infarction, hemorrhage, hydrocephalus, extra-axial collection or mass lesion/mass effect. Global cortical and central atrophy. Secondary mild ventricular prominence. Subcortical white matter and periventricular small vessel ischemic changes. Left basal ganglia lacunar infarct. Vascular: Intracranial atherosclerosis. Skull: Normal. Negative for fracture or focal lesion. Sinuses/Orbits: The visualized paranasal sinuses are essentially clear. The mastoid air cells are unopacified. Other: None. IMPRESSION: Motion degraded images. No evidence of acute intracranial abnormality. Atrophy  with small vessel ischemic changes. Left basal ganglia lacunar infarct. Electronically Signed   By: Charline BillsSriyesh  Krishnan M.D.   On: 04/06/2019 18:18   Dg Chest Portable 1 View  Result Date: 04/06/2019 CLINICAL DATA:  COVID positive, altered mental status, history of UTI EXAM: PORTABLE CHEST 1 VIEW COMPARISON:  12/01/2013 FINDINGS: Lingular and bilateral lower lobe opacities, atelectasis versus pneumonia. No frank interstitial edema. No pleural effusion or pneumothorax. The heart is normal in size.  Thoracic aortic atherosclerosis. IMPRESSION: Lingular and bilateral lower lobe opacities, atelectasis versus pneumonia. Thoracic aortic atherosclerosis. Electronically Signed   By: Charline BillsSriyesh  Krishnan M.D.   On: 04/06/2019 18:15        Scheduled Meds: .  cholecalciferol  400 Units Oral Daily  . dexamethasone  6 mg Oral Daily  . heparin  5,000 Units Subcutaneous Q8H  . multivitamin with minerals  1 tablet Oral Daily  . potassium chloride  40 mEq Oral Once  . QUEtiapine  50 mg Oral BID  . vitamin C  500 mg Oral Daily  . zinc sulfate  220 mg Oral Daily   Continuous Infusions: . 0.9 % NaCl with KCl 20 mEq / L 50 mL/hr at 04/07/19 0223  . cefTRIAXone (ROCEPHIN)  IV       LOS: 1 day    Time spent: 25 minutes    Dorcas CarrowKuber Poet Hineman, MD [facility] Pager [phone]

## 2019-04-08 LAB — COMPREHENSIVE METABOLIC PANEL
ALT: 18 U/L (ref 0–44)
AST: 31 U/L (ref 15–41)
Albumin: 2.4 g/dL — ABNORMAL LOW (ref 3.5–5.0)
Alkaline Phosphatase: 52 U/L (ref 38–126)
Anion gap: 10 (ref 5–15)
BUN: 32 mg/dL — ABNORMAL HIGH (ref 8–23)
CO2: 23 mmol/L (ref 22–32)
Calcium: 8.6 mg/dL — ABNORMAL LOW (ref 8.9–10.3)
Chloride: 113 mmol/L — ABNORMAL HIGH (ref 98–111)
Creatinine, Ser: 1.08 mg/dL — ABNORMAL HIGH (ref 0.44–1.00)
GFR calc Af Amer: 53 mL/min — ABNORMAL LOW (ref >60)
GFR calc non Af Amer: 46 mL/min — ABNORMAL LOW (ref >60)
Glucose, Bld: 103 mg/dL — ABNORMAL HIGH (ref 70–99)
Potassium: 3.8 mmol/L (ref 3.5–5.1)
Sodium: 146 mmol/L — ABNORMAL HIGH (ref 135–145)
Total Bilirubin: 0.6 mg/dL (ref 0.3–1.2)
Total Protein: 6.8 g/dL (ref 6.5–8.1)

## 2019-04-08 LAB — CBC WITH DIFFERENTIAL/PLATELET
Abs Immature Granulocytes: 0.04 10*3/uL (ref 0.00–0.07)
Basophils Absolute: 0 10*3/uL (ref 0.0–0.1)
Basophils Relative: 0 %
Eosinophils Absolute: 0 10*3/uL (ref 0.0–0.5)
Eosinophils Relative: 0 %
HCT: 42.1 % (ref 36.0–46.0)
Hemoglobin: 13.2 g/dL (ref 12.0–15.0)
Immature Granulocytes: 1 %
Lymphocytes Relative: 15 %
Lymphs Abs: 1.2 10*3/uL (ref 0.7–4.0)
MCH: 27.8 pg (ref 26.0–34.0)
MCHC: 31.4 g/dL (ref 30.0–36.0)
MCV: 88.6 fL (ref 80.0–100.0)
Monocytes Absolute: 0.4 10*3/uL (ref 0.1–1.0)
Monocytes Relative: 5 %
Neutro Abs: 6.6 10*3/uL (ref 1.7–7.7)
Neutrophils Relative %: 79 %
Platelets: 305 10*3/uL (ref 150–400)
RBC: 4.75 MIL/uL (ref 3.87–5.11)
RDW: 14.2 % (ref 11.5–15.5)
WBC: 8.3 10*3/uL (ref 4.0–10.5)
nRBC: 0 % (ref 0.0–0.2)

## 2019-04-08 LAB — C-REACTIVE PROTEIN: CRP: 11.5 mg/dL — ABNORMAL HIGH (ref <1.0)

## 2019-04-08 LAB — FERRITIN: Ferritin: 165 ng/mL (ref 11–307)

## 2019-04-08 LAB — MAGNESIUM: Magnesium: 2.3 mg/dL (ref 1.7–2.4)

## 2019-04-08 LAB — PHOSPHORUS: Phosphorus: 2.8 mg/dL (ref 2.5–4.6)

## 2019-04-08 LAB — D-DIMER, QUANTITATIVE: D-Dimer, Quant: 1.99 ug/mL-FEU — ABNORMAL HIGH (ref 0.00–0.50)

## 2019-04-08 MED ORDER — DEXTROSE-NACL 5-0.45 % IV SOLN
INTRAVENOUS | Status: DC
  Administered 2019-04-08 – 2019-04-10 (×3): via INTRAVENOUS

## 2019-04-08 NOTE — Evaluation (Signed)
Clinical/Bedside Swallow Evaluation Patient Details  Name: JAMILA SLATTEN MRN: [id number] Date of Birth: [birth year]  Today's Date: 04/08/2019 Time: SLP Start Time (ACUTE ONLY): 1425 SLP Stop Time (ACUTE ONLY): 1435 SLP Time Calculation (min) (ACUTE ONLY): 10 min  Past Medical History:  Past Medical History:  Diagnosis Date  . Chronic kidney disease    kidney stones, stage 3  . Dementia (HCC)   . Heat stroke   . Hypertension    Past Surgical History:  Past Surgical History:  Procedure Laterality Date  . ABDOMINAL HYSTERECTOMY  1964   partial, Dr. Laural Benes  . COLONOSCOPY     HPI:  pt is an 85 yo female adm to [facility] with acute metabolic encephalopathy and found to have COVID 19.  Pt with PMH + for dementia, paranoid delusions, COPD, GERD, shingles.  Pt CXR lower lobe opacities or ATX.  Pt imaging showed left basal ganglia stroke.  Pt has been refusing po medications and po intake.  Swallow evaluation ordered.   Assessment / Plan / Recommendation Clinical Impression  Tip of straw amount of bolus only- liquid placed in oral cavity with delayed swallow response after excessive holding; She did not accept more intake but clearly articulated "you dirty dog" and "No my doctor did not" when informed MD ordered me to evaluate her swallowing.  Her speech was clear and voice was strong. At this time, recommend offering diet as tolerated but anticipate that she will not eat anything untili she gets home with her daughter.  Recommend in the near future, pt have a palliative consult to establish goals given her advanced age, dementia, COVID + and poor intake.  SlP will sign off.      Aspiration Risk  Other (comment)(po as tolerated, willing to accept)    Diet Recommendation     Liquid Administration via: Cup;Straw Medication Administration: Via alternative means Postural Changes: Seated upright at 90 degrees;Remain upright for at least 30 minutes after po intake    Other  Recommendations Oral  Care Recommendations: Oral care QID   Follow up Recommendations None      Frequency and Duration     n/a       Prognosis    n/a    Swallow Study   General Date of Onset: 04/08/19 HPI: pt is an 85 yo female adm to [facility] with acute metabolic encephalopathy and found to have COVID 19.  Pt with PMH + for dementia, paranoid delusions, COPD, GERD, shingles.  Pt CXR lower lobe opacities or ATX.  Pt imaging showed left basal ganglia stroke.  Pt has been refusing po medications and po intake.  Swallow evaluation ordered. Type of Study: Bedside Swallow Evaluation Diet Prior to this Study: Regular;Thin liquids Temperature Spikes Noted: No Respiratory Status: Room air History of Recent Intubation: No Behavior/Cognition: Alert;Distractible;Agitated;Other (Comment)(does not follow directions) Oral Cavity Assessment: Within Functional Limits Oral Care Completed by SLP: No(pt refused to allow oral care, swats SLP away when examining oral cavity) Oral Cavity - Dentition: Adequate natural dentition Vision: (patient in restraints) Patient Positioning: Upright in bed Baseline Vocal Quality: Normal Volitional Cough: Cognitively unable to elicit Volitional Swallow: Unable to elicit    Oral/Motor/Sensory Function Overall Oral Motor/Sensory Function: Generalized oral weakness(pt is not eating/drinking)   Ice Chips Ice chips: Not tested   Thin Liquid Thin Liquid: Impaired Other Comments: Tip of straw amount of bolus only- liquid placed in oral cavity with delayed swallow response after excessive holding; She did not accept more intake but  clearly articulated "you dirty dog" and "No my doctor did not" when informed MD ordered me to evaluate her swallowing.  Her speech was clear and voice was strong. At this time, recommend offering diet as tolerated but anticipate that she will not eat anything untili she gets home with her daughter.  Recommend in the near future, pt have a palliative consult to establish  goals given her advanced age, dementia, COVID + and poor intake.  SlP will sign off.    Nectar Thick Nectar Thick Liquid: Not tested   Honey Thick Honey Thick Liquid: Not tested   Puree Puree: Not tested Other Comments: pt refused   Solid     Solid: Not tested Other Comments: pt refused      Macario Golds 04/08/2019,3:03 PM  Luanna Salk, [place] SLP Acute Rehab Services Pager [phone] Office [phone]

## 2019-04-08 NOTE — Progress Notes (Signed)
PROGRESS NOTE    Robin Meyers  WPY:[id number] DOB: 1928-12-16 DOA: 04/06/2019 PCP: Lauree Chandler, NP    Brief Narrative:  Patient is 85 year old female with history of dementia, hypertension, CKD stage IIIb who lives at home with his daughter brought to the emergency room with 4-day history of worsening confusion and poor oral intake.  In the emergency room, UA was consistent with acute UTI.  Chest x-ray revealed bilateral lower lobe opacities.  CT head normal.  COVID-19 positive.  Admitted with COVID-19 infection as well UTI.   Assessment & Plan:   Principal Problem:   Acute metabolic encephalopathy Active Problems:   Pneumonia due to COVID-19 virus   CKD (chronic kidney disease), stage III  Acute UTI present on admission: Remains on Rocephin. Blood cultures and urine cultures are negative so far. Rocephin for total 3 days if no positive cultures.   Acute encephalopathy in the setting of underlying severe dementia: CT head negative.  No focal neurological deficits.  Probably progressive dementia aggravated by acute infection. Fall precautions.  Delirium precautions.  Will use Haldol as needed to help prevent falling and pulling on lines.  She is on Seroquel at home that she will continue.  Acute kidney injury on CKD stage III: Renal function is improving.  Recheck tomorrow morning.  With sodium level 146, will change to D5 half-normal saline today.  Pneumonia due to COVID-19 virus: Patient is poor historian.  Does not have much pulmonary symptoms.  She is on room air.  Started on steroids.  Close monitoring.  Breathing treatments.  Call placed and discussed with patient's daughter.  Patient is refusing care.  Most of the time she is unable to take medications.  With underlying dementia, her condition may get worse in the hospital.  She lives with her daughter at home who has completely recovered from COVID-19.  If patient remains fairly stable on room air, she may rather do  well at home.  Will reassess tomorrow morning.  DVT prophylaxis: Heparin subcu Code Status: Full code, confirmed with daughter Family Communication: Patient's daughter called and updated.  Disposition Plan: Anticipate discharge back home with patient's daughter in next 24 to 48 hours.   Consultants:   None  Procedures:   None  Antimicrobials:   Rocephin, 04/06/2019----   Subjective: Patient seen and examined.  No overnight events.  Denies care.  Denies to take any medications.  Remains afebrile.  Mostly on room air.  Objective: Vitals:   04/07/19 1437 04/07/19 2033 04/07/19 2209 04/08/19 0442  BP: (!) 139/58  137/61 140/66  Pulse: 81  80 68  Resp: (!) 22  16 16   Temp: 99.4 F (37.4 C) 98.3 F (36.8 C)  97.9 F (36.6 C)  TempSrc: Axillary Axillary  Axillary  SpO2: 92%  93% 98%  Weight:      Height:        Intake/Output Summary (Last 24 hours) at 04/08/2019 1423 Last data filed at 04/08/2019 1000 Gross per 24 hour  Intake 1178.26 ml  Output 100 ml  Net 1078.26 ml   Filed Weights   04/07/19 1400  Weight: 56.4 kg    Examination:  General exam: Pleasantly confused, agitated.  Not following any commands. On room air. Respiratory system: Clear to auscultation. Respiratory effort normal.  No added sounds. Cardiovascular system: S1 & S2 heard, RRR. No JVD, murmurs, rubs, gallops or clicks. No pedal edema. Gastrointestinal system: Abdomen is nondistended, soft and nontender. No organomegaly or masses felt. Normal bowel  sounds heard. Central nervous system: Alert, confused, not following commands. Extremities: Moving all extremities. Skin: No rashes, lesions or ulcers Psychiatry: Judgement and insight appear compromised.       Data Reviewed: I have personally reviewed following labs and imaging studies  CBC: Recent Labs  Lab 04/06/19 1709 04/07/19 0448 04/08/19 0402  WBC 11.2* 8.9 8.3  NEUTROABS 9.1* 7.2 6.6  HGB 15.2* 14.6 13.2  HCT 47.0* 47.8* 42.1    MCV 88.0 90.5 88.6  PLT 300 296 305   Basic Metabolic Panel: Recent Labs  Lab 04/06/19 1709 04/07/19 0448 04/08/19 0402  NA 142 143 146*  K 3.3* 3.8 3.8  CL 99 107 113*  CO2 28 23 23   GLUCOSE 105* 92 103*  BUN 37* 32* 32*  CREATININE 1.27* 1.25* 1.08*  CALCIUM 9.0 8.6* 8.6*  MG  --  2.4 2.3  PHOS  --  2.9 2.8   GFR: Estimated Creatinine Clearance: 29.8 mL/min (A) (by C-G formula based on SCr of 1.08 mg/dL (H)). Liver Function Tests: Recent Labs  Lab 04/06/19 1709 04/07/19 0448 04/08/19 0402  AST 42* 38 31  ALT 25 21 18   ALKPHOS 65 61 52  BILITOT 1.5* 1.1 0.6  PROT 8.4* 7.6 6.8  ALBUMIN 3.2* 2.8* 2.4*   No results for input(s): LIPASE, AMYLASE in the last 168 hours. Recent Labs  Lab 04/06/19 1709  AMMONIA 11   Coagulation Profile: No results for input(s): INR, PROTIME in the last 168 hours. Cardiac Enzymes: No results for input(s): CKTOTAL, CKMB, CKMBINDEX, TROPONINI in the last 168 hours. BNP (last 3 results) No results for input(s): PROBNP in the last 8760 hours. HbA1C: No results for input(s): HGBA1C in the last 72 hours. CBG: Recent Labs  Lab 04/06/19 1708  GLUCAP 84   Lipid Profile: No results for input(s): CHOL, HDL, LDLCALC, TRIG, CHOLHDL, LDLDIRECT in the last 72 hours. Thyroid Function Tests: No results for input(s): TSH, T4TOTAL, FREET4, T3FREE, THYROIDAB in the last 72 hours. Anemia Panel: Recent Labs    04/07/19 0448 04/08/19 0402  FERRITIN 221 165   Sepsis Labs: Recent Labs  Lab 04/06/19 1709  LATICACIDVEN 1.9    Recent Results (from the past 240 hour(s))  Urine C&S     Status: None   Collection Time: 04/06/19  5:00 PM   Specimen: Urine, Clean Catch  Result Value Ref Range Status   Specimen Description   Final    URINE, CLEAN CATCH Performed at [facility], [address], [place], [place]    Special Requests   Final    NONE Performed at [facility], [address] [place], [address] [place]    Culture   Final    NO GROWTH Performed at [facility], [address], [place], [address] [place]    Report Status 04/07/2019 FINAL  Final  Respiratory Panel by PCR     Status: None   Collection Time: 04/07/19  1:44 AM   Specimen: Nasopharyngeal Swab; Respiratory  Result Value Ref Range Status   Adenovirus NOT DETECTED NOT DETECTED Final   Coronavirus 229E NOT DETECTED NOT DETECTED Final    Comment: (NOTE) The Coronavirus on the Respiratory Panel, DOES NOT test for the novel  Coronavirus (2019 nCoV)    Coronavirus HKU1 NOT DETECTED NOT DETECTED Final   Coronavirus NL63 NOT DETECTED NOT DETECTED Final   Coronavirus OC43 NOT DETECTED NOT DETECTED Final   Metapneumovirus NOT DETECTED NOT DETECTED Final  Rhinovirus / Enterovirus NOT DETECTED NOT DETECTED Final   Influenza A NOT DETECTED NOT DETECTED Final   Influenza B NOT DETECTED NOT DETECTED Final   Parainfluenza Virus 1 NOT DETECTED NOT DETECTED Final   Parainfluenza Virus 2 NOT DETECTED NOT DETECTED Final   Parainfluenza Virus 3 NOT DETECTED NOT DETECTED Final   Parainfluenza Virus 4 NOT DETECTED NOT DETECTED Final   Respiratory Syncytial Virus NOT DETECTED NOT DETECTED Final   Bordetella pertussis NOT DETECTED NOT DETECTED Final   Chlamydophila pneumoniae NOT DETECTED NOT DETECTED Final   Mycoplasma pneumoniae NOT DETECTED NOT DETECTED Final    Comment: Performed at [facility], [address], [place], [place] [address]  Culture, blood (Routine X 2) w Reflex to ID Panel     Status: None (Preliminary result)   Collection Time: 04/07/19  4:48 AM   Specimen: BLOOD RIGHT FOREARM  Result Value Ref Range Status   Specimen Description   Final    BLOOD RIGHT FOREARM Performed at [facility], [address], [place], [place] [address]    Special Requests   Final    BOTTLES DRAWN AEROBIC ONLY Blood Culture results may not be optimal due to an inadequate volume of blood received in  culture bottles Performed at [facility], [address] [place] [address], [place], [place] [address]    Culture   Final    NO GROWTH 1 DAY Performed at [facility], [address], [place], [place] [address]    Report Status PENDING  Incomplete  Culture, blood (Routine X 2) w Reflex to ID Panel     Status: None (Preliminary result)   Collection Time: 04/07/19  4:48 AM   Specimen: BLOOD  Result Value Ref Range Status   Specimen Description   Final    BLOOD RIGHT ANTECUBITAL Performed at [facility], [address], [place], [place] [address]    Special Requests   Final    BOTTLES DRAWN AEROBIC ONLY Blood Culture results may not be optimal due to an inadequate volume of blood received in culture bottles Performed at [facility], [address], [place], [place] [address]    Culture   Final    NO GROWTH 1 DAY Performed at [facility], [address] [place], [place] [address]    Report Status PENDING  Incomplete         Radiology Studies: Ct Head Wo Contrast  Result Date: 04/06/2019 CLINICAL DATA:  Confusion/altered mental status, dementia EXAM: CT HEAD WITHOUT CONTRAST TECHNIQUE: Contiguous axial images were obtained from the base of the skull through the vertex without intravenous contrast. COMPARISON:  12/02/2013 FINDINGS: Motion degraded images. Brain: No evidence of acute infarction, hemorrhage, hydrocephalus, extra-axial collection or mass lesion/mass effect. Global cortical and central atrophy. Secondary mild ventricular prominence. Subcortical white matter and periventricular small vessel ischemic changes. Left basal ganglia lacunar infarct. Vascular: Intracranial atherosclerosis. Skull: Normal. Negative for fracture or focal lesion. Sinuses/Orbits: The visualized paranasal sinuses are essentially clear. The mastoid air cells are unopacified. Other: None. IMPRESSION: Motion degraded images. No evidence of acute  intracranial abnormality. Atrophy with small vessel ischemic changes. Left basal ganglia lacunar infarct. Electronically Signed   By: Charline Bills M.D.   On: 04/06/2019 18:18   Dg Chest Portable 1 View  Result Date: 04/06/2019 CLINICAL DATA:  COVID positive, altered mental status, history of UTI EXAM: PORTABLE CHEST 1 VIEW COMPARISON:  12/01/2013 FINDINGS: Lingular and bilateral  lower lobe opacities, atelectasis versus pneumonia. No frank interstitial edema. No pleural effusion or pneumothorax. The heart is normal in size.  Thoracic aortic atherosclerosis. IMPRESSION: Lingular and bilateral lower lobe opacities, atelectasis versus pneumonia. Thoracic aortic atherosclerosis. Electronically Signed   By: Charline BillsSriyesh  Krishnan M.D.   On: 04/06/2019 18:15        Scheduled Meds:  cholecalciferol  400 Units Oral Daily   dexamethasone  6 mg Oral Daily   heparin  5,000 Units Subcutaneous Q8H   multivitamin with minerals  1 tablet Oral Daily   potassium chloride  40 mEq Oral Once   QUEtiapine  50 mg Oral BID   vitamin C  500 mg Oral Daily   zinc sulfate  220 mg Oral Daily   Continuous Infusions:  cefTRIAXone (ROCEPHIN)  IV 1 g (04/07/19 1805)   dextrose 5 % and 0.45% NaCl 50 mL/hr at 04/08/19 1000     LOS: 2 days    Time spent: 25 minutes    Dorcas CarrowKuber Samuele Storey, MD [facility] Pager [phone]

## 2019-04-08 NOTE — Progress Notes (Signed)
OT Cancellation Note  Patient Details Name: Robin Meyers MRN: [id number] DOB: [birth year]   Cancelled Treatment:    Reason Eval/Treat Not Completed: Other (comment).  Pt agitated at this time. Will check back another day  [name] 04/08/2019, 2:28 PM  Lesle Chris, OTR/L Acute Rehabilitation Services [phone] [facility] pager [phone] office 04/08/2019

## 2019-04-08 NOTE — Progress Notes (Signed)
Attempted to give meds again and she refused.

## 2019-04-08 NOTE — Progress Notes (Signed)
Patient refusing for RN to take vitals at this time, swatting hand away. Otherwise pt is resting peacefully in bed not attempting to get up. Will continue to monitor.

## 2019-04-08 NOTE — Progress Notes (Signed)
PT Cancellation Note  Patient Details Name: Robin Meyers MRN: [id number] DOB: 1928/12/30   Cancelled Treatment:    Reason Eval/Treat Not Completed: Medical issues which prohibited therapy;Pain limiting ability to participate;Other (comment)(Per SLP pt is currently aggitated and is not agreeable to participate in therapies at this time. Will attempt at later date/time as schedule allows and when pt medically ready and agreeable.)   Kipp Brood, PT, DPT Physical Therapist with [facility]  04/08/2019 2:27 PM

## 2019-04-08 NOTE — Progress Notes (Addendum)
Attempted to give morning meds and pt refused. Even called daughter to see if she could talk her into taking them but it did not help. Trying to climb out of bed, gave Haldol, will attempt later to give medications.

## 2019-04-09 LAB — COMPREHENSIVE METABOLIC PANEL
ALT: 22 U/L (ref 0–44)
AST: 42 U/L — ABNORMAL HIGH (ref 15–41)
Albumin: 2.7 g/dL — ABNORMAL LOW (ref 3.5–5.0)
Alkaline Phosphatase: 62 U/L (ref 38–126)
Anion gap: 10 (ref 5–15)
BUN: 22 mg/dL (ref 8–23)
CO2: 25 mmol/L (ref 22–32)
Calcium: 9.1 mg/dL (ref 8.9–10.3)
Chloride: 110 mmol/L (ref 98–111)
Creatinine, Ser: 1.01 mg/dL — ABNORMAL HIGH (ref 0.44–1.00)
GFR calc Af Amer: 58 mL/min — ABNORMAL LOW (ref >60)
GFR calc non Af Amer: 50 mL/min — ABNORMAL LOW (ref >60)
Glucose, Bld: 113 mg/dL — ABNORMAL HIGH (ref 70–99)
Potassium: 4.8 mmol/L (ref 3.5–5.1)
Sodium: 145 mmol/L (ref 135–145)
Total Bilirubin: 1.1 mg/dL (ref 0.3–1.2)
Total Protein: 7.8 g/dL (ref 6.5–8.1)

## 2019-04-09 LAB — CBC WITH DIFFERENTIAL/PLATELET
Abs Immature Granulocytes: 0.05 10*3/uL (ref 0.00–0.07)
Basophils Absolute: 0 10*3/uL (ref 0.0–0.1)
Basophils Relative: 0 %
Eosinophils Absolute: 0 10*3/uL (ref 0.0–0.5)
Eosinophils Relative: 0 %
HCT: 47 % — ABNORMAL HIGH (ref 36.0–46.0)
Hemoglobin: 14.2 g/dL (ref 12.0–15.0)
Immature Granulocytes: 1 %
Lymphocytes Relative: 17 %
Lymphs Abs: 1.5 10*3/uL (ref 0.7–4.0)
MCH: 27.9 pg (ref 26.0–34.0)
MCHC: 30.2 g/dL (ref 30.0–36.0)
MCV: 92.3 fL (ref 80.0–100.0)
Monocytes Absolute: 0.6 10*3/uL (ref 0.1–1.0)
Monocytes Relative: 7 %
Neutro Abs: 6.4 10*3/uL (ref 1.7–7.7)
Neutrophils Relative %: 75 %
Platelets: 369 10*3/uL (ref 150–400)
RBC: 5.09 MIL/uL (ref 3.87–5.11)
RDW: 14.2 % (ref 11.5–15.5)
WBC: 8.5 10*3/uL (ref 4.0–10.5)
nRBC: 0 % (ref 0.0–0.2)

## 2019-04-09 LAB — C-REACTIVE PROTEIN: CRP: 9.3 mg/dL — ABNORMAL HIGH (ref <1.0)

## 2019-04-09 LAB — PHOSPHORUS: Phosphorus: 2.5 mg/dL (ref 2.5–4.6)

## 2019-04-09 LAB — D-DIMER, QUANTITATIVE: D-Dimer, Quant: 2.21 ug/mL-FEU — ABNORMAL HIGH (ref 0.00–0.50)

## 2019-04-09 LAB — MAGNESIUM: Magnesium: 2.1 mg/dL (ref 1.7–2.4)

## 2019-04-09 LAB — FERRITIN: Ferritin: 216 ng/mL (ref 11–307)

## 2019-04-09 NOTE — Progress Notes (Signed)
PT Cancellation Note  Patient Details Name: Robin Meyers MRN: [id number] DOB: January 22, 1929   Cancelled Treatment:     PT deferred this date - pt continues agitated and has just received Haldol.  WIll follow.Debe Coder PT Acute Rehabilitation Services Pager [phone] Office [phone]    [facility] 04/09/2019, 12:41 PM

## 2019-04-09 NOTE — Progress Notes (Signed)
PROGRESS NOTE    Robin Meyers  JEH:[id number] DOB: 08-04-1928 DOA: 04/06/2019 PCP: Sharon Seller, NP    Brief Narrative:  Patient is 85 year old female with history of dementia, hypertension, CKD stage IIIb who lives at home with his daughter brought to the emergency room with 4-day history of worsening confusion and poor oral intake.  In the emergency room, UA was consistent with acute UTI.  Chest x-ray revealed bilateral lower lobe opacities.  CT head normal.  COVID-19 positive.  Admitted with COVID-19 infection as well suspect UTI.   Assessment & Plan:   Principal Problem:   Acute metabolic encephalopathy Active Problems:   Pneumonia due to COVID-19 virus   CKD (chronic kidney disease), stage III  Acute UTI present on admission: Ruled out.  Treated with Rocephin for 3 days.  Will discontinue.  Blood cultures and urine cultures are negative so far.  Acute encephalopathy in the setting of underlying severe dementia: CT head negative.  No focal neurological deficits.  Probably progressive dementia aggravated by acute infection. Fall precautions.  Delirium precautions.  Will use Haldol as needed to help prevent falling and pulling on lines.  She is on Seroquel at home that she will continue.  Acute kidney injury on CKD stage III: Renal function is improving.  On maintenance dextrose.  Encourage oral liquid.   Pneumonia due to COVID-19 virus: Patient is poor historian.  Does not have much pulmonary symptoms.  She is on room air.  Started on steroids.  Close monitoring.  Breathing treatments.  Patient with dementia, refusing care, refusing to work with therapies.  She has not ambulated. She had difficulty ambulating with nurse and nurses technician. Discontinue antibiotics today. We will continue to attempt to mobilize in order to prepare for discharge.  DVT prophylaxis: Heparin subcu Code Status: Full code, confirmed with daughter Family Communication: Patient's daughter  called and updated.  Disposition Plan: Anticipate discharge back home with patient's daughter tomorrow if remains stable.   Consultants:   None  Procedures:   None  Antimicrobials:   Rocephin, 04/06/2019----   Subjective: Seen and examined.  No overnight events.  Intermittent agitation.  Afebrile.  On room air. Objective: Vitals:   04/07/19 1437 04/07/19 2033 04/07/19 2209 04/08/19 0442  BP: (!) 139/58  137/61 140/66  Pulse: 81  80 68  Resp: (!) 22  16 16   Temp: 99.4 F (37.4 C) 98.3 F (36.8 C)  97.9 F (36.6 C)  TempSrc: Axillary Axillary  Axillary  SpO2: 92%  93% 98%  Weight:    56.6 kg  Height:        Intake/Output Summary (Last 24 hours) at 04/09/2019 1241 Last data filed at 04/08/2019 1400 Gross per 24 hour  Intake 200 ml  Output -  Net 200 ml   Filed Weights   04/07/19 1400 04/08/19 0442  Weight: 56.4 kg 56.6 kg    Examination:  General exam: Pleasantly confused, agitated.  On room air. Respiratory system: Clear to auscultation. Respiratory effort normal.  No added sounds. Cardiovascular system: S1 & S2 heard, RRR. No JVD, murmurs, rubs, gallops or clicks. No pedal edema. Gastrointestinal system: Abdomen is nondistended, soft and nontender. No organomegaly or masses felt. Normal bowel sounds heard. Central nervous system: Alert, confused, not following commands. Extremities: Moving all extremities. Skin: No rashes, lesions or ulcers Psychiatry: Judgement and insight appear compromised.       Data Reviewed: I have personally reviewed following labs and imaging studies  CBC: Recent Labs  Lab 04/06/19 1709 04/07/19 0448 04/08/19 0402 04/09/19 0416  WBC 11.2* 8.9 8.3 8.5  NEUTROABS 9.1* 7.2 6.6 6.4  HGB 15.2* 14.6 13.2 14.2  HCT 47.0* 47.8* 42.1 47.0*  MCV 88.0 90.5 88.6 92.3  PLT 300 296 305 629   Basic Metabolic Panel: Recent Labs  Lab 04/06/19 1709 04/07/19 0448 04/08/19 0402 04/09/19 0416  NA 142 143 146* 145  K 3.3* 3.8 3.8  4.8  CL 99 107 113* 110  CO2 28 23 23 25   GLUCOSE 105* 92 103* 113*  BUN 37* 32* 32* 22  CREATININE 1.27* 1.25* 1.08* 1.01*  CALCIUM 9.0 8.6* 8.6* 9.1  MG  --  2.4 2.3 2.1  PHOS  --  2.9 2.8 2.5   GFR: Estimated Creatinine Clearance: 31.8 mL/min (A) (by C-G formula based on SCr of 1.01 mg/dL (H)). Liver Function Tests: Recent Labs  Lab 04/06/19 1709 04/07/19 0448 04/08/19 0402 04/09/19 0416  AST 42* 38 31 42*  ALT 25 21 18 22   ALKPHOS 65 61 52 62  BILITOT 1.5* 1.1 0.6 1.1  PROT 8.4* 7.6 6.8 7.8  ALBUMIN 3.2* 2.8* 2.4* 2.7*   No results for input(s): LIPASE, AMYLASE in the last 168 hours. Recent Labs  Lab 04/06/19 1709  AMMONIA 11   Coagulation Profile: No results for input(s): INR, PROTIME in the last 168 hours. Cardiac Enzymes: No results for input(s): CKTOTAL, CKMB, CKMBINDEX, TROPONINI in the last 168 hours. BNP (last 3 results) No results for input(s): PROBNP in the last 8760 hours. HbA1C: No results for input(s): HGBA1C in the last 72 hours. CBG: Recent Labs  Lab 04/06/19 1708  GLUCAP 84   Lipid Profile: No results for input(s): CHOL, HDL, LDLCALC, TRIG, CHOLHDL, LDLDIRECT in the last 72 hours. Thyroid Function Tests: No results for input(s): TSH, T4TOTAL, FREET4, T3FREE, THYROIDAB in the last 72 hours. Anemia Panel: Recent Labs    04/08/19 0402 04/09/19 0416  FERRITIN 165 216   Sepsis Labs: Recent Labs  Lab 04/06/19 1709  LATICACIDVEN 1.9    Recent Results (from the past 240 hour(s))  Urine C&S     Status: None   Collection Time: 04/06/19  5:00 PM   Specimen: Urine, Clean Catch  Result Value Ref Range Status   Specimen Description   Final    URINE, CLEAN CATCH Performed at [facility], [place] [address], [place], [place] [address]    Special Requests   Final    NONE Performed at [facility], [place] [address] [place], [place] [address]    Culture   Final    NO GROWTH Performed at [facility], [place] [address], [place], [place] [address]    Report Status 04/07/2019 FINAL  Final  Respiratory Panel by PCR     Status: None   Collection Time: 04/07/19  1:44 AM   Specimen: Nasopharyngeal Swab; Respiratory  Result Value Ref Range Status   Adenovirus NOT DETECTED NOT DETECTED Final   Coronavirus 229E NOT DETECTED NOT DETECTED Final    Comment: (NOTE) The Coronavirus on the Respiratory Panel, DOES NOT test for the novel  Coronavirus (2019 nCoV)    Coronavirus HKU1 NOT DETECTED NOT DETECTED Final   Coronavirus NL63 NOT DETECTED NOT DETECTED Final   Coronavirus OC43 NOT DETECTED NOT DETECTED Final   Metapneumovirus NOT DETECTED NOT DETECTED Final   Rhinovirus / Enterovirus NOT DETECTED NOT DETECTED Final   Influenza A NOT DETECTED NOT DETECTED Final   Influenza  B NOT DETECTED NOT DETECTED Final   Parainfluenza Virus 1 NOT DETECTED NOT DETECTED Final   Parainfluenza Virus 2 NOT DETECTED NOT DETECTED Final   Parainfluenza Virus 3 NOT DETECTED NOT DETECTED Final   Parainfluenza Virus 4 NOT DETECTED NOT DETECTED Final   Respiratory Syncytial Virus NOT DETECTED NOT DETECTED Final   Bordetella pertussis NOT DETECTED NOT DETECTED Final   Chlamydophila pneumoniae NOT DETECTED NOT DETECTED Final   Mycoplasma pneumoniae NOT DETECTED NOT DETECTED Final    Comment: Performed at [facility], [address] [place], [place] [address]  Culture, blood (Routine X 2) w Reflex to ID Panel     Status: None (Preliminary result)   Collection Time: 04/07/19  4:48 AM   Specimen: BLOOD RIGHT FOREARM  Result Value Ref Range Status   Specimen Description   Final    BLOOD RIGHT FOREARM Performed at [facility], [address] [place], [place] [address]    Special Requests   Final    BOTTLES DRAWN AEROBIC ONLY Blood Culture results may not be optimal due to an inadequate volume of blood received in culture bottles Performed at [facility], [address]  [place], [place] [address]    Culture   Final    NO GROWTH 1 DAY Performed at [facility], [address] [place], [place] [address]    Report Status PENDING  Incomplete  Culture, blood (Routine X 2) w Reflex to ID Panel     Status: None (Preliminary result)   Collection Time: 04/07/19  4:48 AM   Specimen: BLOOD  Result Value Ref Range Status   Specimen Description   Final    BLOOD RIGHT ANTECUBITAL Performed at [facility], [address] [place], [place] [address]    Special Requests   Final    BOTTLES DRAWN AEROBIC ONLY Blood Culture results may not be optimal due to an inadequate volume of blood received in culture bottles Performed at [facility], [address] [place], [place] [address]    Culture   Final    NO GROWTH 1 DAY Performed at [facility], [address] [place], [place] [address]    Report Status PENDING  Incomplete         Radiology Studies: No results found.      Scheduled Meds: . cholecalciferol  400 Units Oral Daily  . dexamethasone  6 mg Oral Daily  . heparin  5,000 Units Subcutaneous Q8H  . multivitamin with minerals  1 tablet Oral Daily  . potassium chloride  40 mEq Oral Once  . QUEtiapine  50 mg Oral BID  . vitamin C  500 mg Oral Daily  . zinc sulfate  220 mg Oral Daily   Continuous Infusions: . dextrose 5 % and 0.45% NaCl 50 mL/hr at 04/09/19 0605     LOS: 3 days    Time spent: 25 minutes    Dorcas CarrowKuber Honorio Devol, MD [facility] Pager [phone]

## 2019-04-09 NOTE — Progress Notes (Signed)
OT Cancellation Note  Patient Details Name: ONETTA SPAINHOWER MRN: [id number] DOB: Dec 08, 1928   Cancelled Treatment:    Reason Eval/Treat Not Completed: Other (comment).  Pt is not appropriate at this time.  Remains agitated; had Haldol.  Merdith Adan 04/09/2019, 12:39 PM  Lesle Chris, OTR/L Acute Rehabilitation Services [phone] [facility] pager [phone] office 04/09/2019

## 2019-04-09 NOTE — Progress Notes (Signed)
Pt had 26 beat run of SVT this shift at 1639. Pt remained asymptomatic throughout. MD notified, no new orders issued. Will continue to monitor.

## 2019-04-10 LAB — CBC WITH DIFFERENTIAL/PLATELET
Abs Immature Granulocytes: 0.07 10*3/uL (ref 0.00–0.07)
Basophils Absolute: 0 10*3/uL (ref 0.0–0.1)
Basophils Relative: 0 %
Eosinophils Absolute: 0 10*3/uL (ref 0.0–0.5)
Eosinophils Relative: 0 %
HCT: 45.9 % (ref 36.0–46.0)
Hemoglobin: 14.2 g/dL (ref 12.0–15.0)
Immature Granulocytes: 1 %
Lymphocytes Relative: 20 %
Lymphs Abs: 2.1 10*3/uL (ref 0.7–4.0)
MCH: 27.6 pg (ref 26.0–34.0)
MCHC: 30.9 g/dL (ref 30.0–36.0)
MCV: 89.1 fL (ref 80.0–100.0)
Monocytes Absolute: 0.6 10*3/uL (ref 0.1–1.0)
Monocytes Relative: 6 %
Neutro Abs: 7.8 10*3/uL — ABNORMAL HIGH (ref 1.7–7.7)
Neutrophils Relative %: 73 %
Platelets: 379 10*3/uL (ref 150–400)
RBC: 5.15 MIL/uL — ABNORMAL HIGH (ref 3.87–5.11)
RDW: 14.1 % (ref 11.5–15.5)
WBC: 10.7 10*3/uL — ABNORMAL HIGH (ref 4.0–10.5)
nRBC: 0 % (ref 0.0–0.2)

## 2019-04-10 LAB — COMPREHENSIVE METABOLIC PANEL
ALT: 22 U/L (ref 0–44)
AST: 41 U/L (ref 15–41)
Albumin: 2.5 g/dL — ABNORMAL LOW (ref 3.5–5.0)
Alkaline Phosphatase: 61 U/L (ref 38–126)
Anion gap: 11 (ref 5–15)
BUN: 17 mg/dL (ref 8–23)
CO2: 25 mmol/L (ref 22–32)
Calcium: 9.1 mg/dL (ref 8.9–10.3)
Chloride: 108 mmol/L (ref 98–111)
Creatinine, Ser: 0.88 mg/dL (ref 0.44–1.00)
GFR calc Af Amer: >60 mL/min (ref >60)
GFR calc non Af Amer: 59 mL/min — ABNORMAL LOW (ref >60)
Glucose, Bld: 115 mg/dL — ABNORMAL HIGH (ref 70–99)
Potassium: 3.9 mmol/L (ref 3.5–5.1)
Sodium: 144 mmol/L (ref 135–145)
Total Bilirubin: 0.8 mg/dL (ref 0.3–1.2)
Total Protein: 7.5 g/dL (ref 6.5–8.1)

## 2019-04-10 LAB — PHOSPHORUS: Phosphorus: 2.9 mg/dL (ref 2.5–4.6)

## 2019-04-10 LAB — MAGNESIUM: Magnesium: 2.1 mg/dL (ref 1.7–2.4)

## 2019-04-10 LAB — D-DIMER, QUANTITATIVE: D-Dimer, Quant: 2.97 ug/mL-FEU — ABNORMAL HIGH (ref 0.00–0.50)

## 2019-04-10 LAB — C-REACTIVE PROTEIN: CRP: 9.4 mg/dL — ABNORMAL HIGH (ref <1.0)

## 2019-04-10 LAB — FERRITIN: Ferritin: 312 ng/mL — ABNORMAL HIGH (ref 11–307)

## 2019-04-10 MED ORDER — DEXAMETHASONE 6 MG PO TABS: 6.0000 mg | ORAL_TABLET | Freq: Every day | ORAL | 0 refills | Status: AC

## 2019-04-10 NOTE — Evaluation (Signed)
Occupational Therapy Evaluation Patient Details Name: Robin Meyers MRN: [id number] DOB: 1928-12-28 Today's Date: 04/10/2019    History of Present Illness 85 year old female admitted for acute metabolic encephalopathy. PMH:  dementia   Clinical Impression   Pt was admitted for the above. She lives with her daughter and walked prior to admission. Pt did attempt to help with taking sips of juice and washing face, but she needs total A for these and all ADLs/mobility. Will follow in acute setting with max A level goals.  Recommend 24/7 vs snf depending on if daughter can manage her as well as her progress.    Follow Up Recommendations  Supervision/Assistance - 24 hour;SNF    Equipment Recommendations  None recommended by OT    Recommendations for Other Services       Precautions / Restrictions Precautions Precautions: Fall Precaution Comments: agitated at times Restrictions Weight Bearing Restrictions: No      Mobility Bed Mobility               General bed mobility comments: total A +2 for all bed mobility, pt 0%  Transfers                 General transfer comment: attempted standing x 2.  Pt did not fully extend trunk    Balance Overall balance assessment: Needs assistance     Sitting balance - Comments: poor to fair:  min guard as she progressed to holding herself up at EOB                                   ADL either performed or assessed with clinical judgement   ADL Overall ADL's : Needs assistance/impaired Eating/Feeding: Total assistance(pt 10%)   Grooming: Wash/dry face;Total assistance(pt 10%)                                 General ADL Comments: pt needs total A for all ADLs. She did assist with holding cup and therapist controlled straw/amount. Hand over hand assist for grooming     Vision         Perception     Praxis      Pertinent Vitals/Pain Pain Assessment: Faces Faces Pain Scale: No hurt      Hand Dominance     Extremity/Trunk Assessment Upper Extremity Assessment Upper Extremity Assessment: Difficult to assess due to impaired cognition           Communication Communication Communication: (non verbal did not follow commands)   Cognition Arousal/Alertness: Awake/alert Behavior During Therapy: WFL for tasks assessed/performed Overall Cognitive Status: No family/caregiver present to determine baseline cognitive functioning                                 General Comments: dementia at baseline   General Comments       Exercises     Shoulder Instructions      Home Living Family/patient expects to be discharged to:: Private residence Living Arrangements: Children(daughter)                                      Prior Functioning/Environment          Comments: walked per  MD.  Marena Chancy how much adl assistance she needed        OT Problem List: Decreased strength;Decreased activity tolerance;Impaired balance (sitting and/or standing);Decreased cognition      OT Treatment/Interventions: Self-care/ADL training;Therapeutic activities;Patient/family education;Balance training;Cognitive remediation/compensation    OT Goals(Current goals can be found in the care plan section) Acute Rehab OT Goals Patient Stated Goal: unable  OT Goal Formulation: Patient unable to participate in goal setting Time For Goal Achievement: 04/24/19 Potential to Achieve Goals: Fair ADL Goals Pt Will Transfer to Toilet: with max assist;bedside commode;stand pivot transfer Additional ADL Goal #1: pt will go from sit to stand wtih walker with max A and maintain with min A for 2 minutes for adls Additional ADL Goal #2: pt will perform self feeding and simple grooming with max A  OT Frequency: Min 2X/week   Barriers to D/C:            Co-evaluation PT/OT/SLP Co-Evaluation/Treatment: Yes Reason for Co-Treatment: For patient/therapist safety;Complexity  of the patient's impairments (multi-system involvement) PT goals addressed during session: Mobility/safety with mobility;Balance OT goals addressed during session: ADL's and self-care      AM-PAC OT "6 Clicks" Daily Activity     Outcome Measure Help from another person eating meals?: Total Help from another person taking care of personal grooming?: Total Help from another person toileting, which includes using toliet, bedpan, or urinal?: Total Help from another person bathing (including washing, rinsing, drying)?: Total Help from another person to put on and taking off regular upper body clothing?: Total Help from another person to put on and taking off regular lower body clothing?: Total 6 Click Score: 6   End of Session    Activity Tolerance: Patient tolerated treatment well Patient left: in bed;with call bell/phone within reach;with bed alarm set  OT Visit Diagnosis: Muscle weakness (generalized) (M62.81);Unsteadiness on feet (R26.81);Cognitive communication deficit (R41.841)                Time: 3007-6226 OT Time Calculation (min): 26 min Charges:  OT General Charges $OT Visit: 1 Visit OT Evaluation $OT Eval Low Complexity: [place], OTR/L Acute Rehabilitation Services [phone] [facility] pager [phone] office 04/10/2019  [place] 04/10/2019, 11:17 AM

## 2019-04-10 NOTE — TOC Progression Note (Signed)
Transition of Care [facility]) - Progression Note    Patient Details  Name: Robin Meyers MRN: [id number] Date of Birth: 1929/03/31  Transition [facility]) CM/SW Contact  Joaquin Courts, RN Phone Number: 04/10/2019, 2:10 PM  Clinical Narrative:    Patient set up with [facility] for HHPT and OT. No DME needs.    Expected Discharge Plan: [place] Barriers to Discharge: No Barriers Identified  Expected Discharge Plan and Services Expected Discharge Plan: [place]   Discharge Planning Services: CM Consult Post Acute Care Choice: [place] arrangements for the past 2 months: Single Family Home Expected Discharge Date: 04/10/19               DME Arranged: N/A DME Agency: NA       HH Arranged: OT, PT HH Agency: [place] Date [place]: 04/10/19 Time [place]: [id number] Representative spoke with at [place]: [place] ([place]) Interventions    Readmission Risk Interventions No flowsheet data found.

## 2019-04-10 NOTE — Discharge Summary (Signed)
Physician Discharge Summary  Robin Meyers ZOX:[id number] DOB: 1928-07-05 DOA: 04/06/2019  PCP: Sharon Seller, NP  Admit date: 04/06/2019 Discharge date: 04/10/2019  Admitted From: Home Disposition: Home  Recommendations for Outpatient Follow-up:  1. Follow up with PCP in 1-2 weeks   Home Health: PT OT Equipment/Devices: None  Discharge Condition: Stable CODE STATUS: Full code Diet recommendation: Regular diet.  Aspiration precautions.  Discharge summary: Patient is 85 year old female with history of dementia, hypertension, CKD stage IIIb who lives at home with her daughter brought to the emergency room with 4-day history of worsening confusion and poor oral intake.  In the emergency room, UA was consistent with acute UTI.  Chest x-ray revealed bilateral lower lobe opacities.  CT head normal.  COVID-19 positive.  Admitted with COVID-19 infection as well suspected UTI.  Suspected UTI present on admission: Ruled out.  Blood cultures negative.  Urine culture with no growth.  Treated with Rocephin for 3 days and discontinued.  Due to her advanced dementia is difficult to know about the symptoms, however she had taken 3 days of antibiotics that should be enough with negative cultures.  Acute encephalopathy in the setting of underlying severe dementia: CT head was normal.  No focal neurological deficit.  Patient has significant debility and not able to participate on nursing care.  Was using as needed Haldol.  She is on Seroquel at home. Patient has dementia with behavioral disturbances, she has issue taking medications and eating with nursing staff.  Was seen by PT OT today, very poor participation. Called and discussed with patient's daughter in detail about further care at home versus sending her to a skilled nursing facility. Patient's daughter recovered from COVID-19 and she is able to provide her mother 24 hours care.  She wants to take her home in order to be in a familiar  situation to see if that helps. Patient does have 24/7 care at home with her daughter, hence discharging home with home PT OT.  If unable to provide complete care, patient may need to go to skilled rehab.  Pneumonia due to COVID-19 virus: Patient has some cough.  She is afebrile.  She is on room air.  Minimal symptomatic.  Treated with dexamethasone.  We will continue 6 more days to complete 10 days of therapy.  Fairly stable from Covid pneumonia standpoint.   Discharge Diagnoses:  Principal Problem:   Acute metabolic encephalopathy Active Problems:   Pneumonia due to COVID-19 virus   CKD (chronic kidney disease), stage III    Discharge Instructions  Discharge Instructions    Call MD for:  difficulty breathing, headache or visual disturbances   Complete by: As directed    Call MD for:  temperature >100.4   Complete by: As directed    Diet general   Complete by: As directed    Increase activity slowly   Complete by: As directed      Allergies as of 04/10/2019   No Known Allergies     Medication List    TAKE these medications   amLODipine 10 MG tablet Commonly known as: NORVASC Take 10 mg by mouth daily.   chlorthalidone 50 MG tablet Commonly known as: HYGROTON Take 25 mg by mouth daily.   dexamethasone 6 MG tablet Commonly known as: DECADRON Take 1 tablet (6 mg total) by mouth daily for 6 days. Start taking on: April 11, 2019   multivitamin with minerals Tabs tablet Take 1 tablet by mouth daily.   QUEtiapine 50  MG tablet Commonly known as: SEROQUEL Take 50 mg by mouth 2 (two) times daily.   VITAMIN C PO Take 1 tablet by mouth daily.   Vitamin D3 10 MCG (400 UNIT) tablet Take 400 Units by mouth daily.       No Known Allergies  Consultations:  None   Procedures/Studies: Ct Head Wo Contrast  Result Date: 04/06/2019 CLINICAL DATA:  Confusion/altered mental status, dementia EXAM: CT HEAD WITHOUT CONTRAST TECHNIQUE: Contiguous axial images were  obtained from the base of the skull through the vertex without intravenous contrast. COMPARISON:  12/02/2013 FINDINGS: Motion degraded images. Brain: No evidence of acute infarction, hemorrhage, hydrocephalus, extra-axial collection or mass lesion/mass effect. Global cortical and central atrophy. Secondary mild ventricular prominence. Subcortical white matter and periventricular small vessel ischemic changes. Left basal ganglia lacunar infarct. Vascular: Intracranial atherosclerosis. Skull: Normal. Negative for fracture or focal lesion. Sinuses/Orbits: The visualized paranasal sinuses are essentially clear. The mastoid air cells are unopacified. Other: None. IMPRESSION: Motion degraded images. No evidence of acute intracranial abnormality. Atrophy with small vessel ischemic changes. Left basal ganglia lacunar infarct. Electronically Signed   By: Charline Bills M.D.   On: 04/06/2019 18:18   Dg Chest Portable 1 View  Result Date: 04/06/2019 CLINICAL DATA:  COVID positive, altered mental status, history of UTI EXAM: PORTABLE CHEST 1 VIEW COMPARISON:  12/01/2013 FINDINGS: Lingular and bilateral lower lobe opacities, atelectasis versus pneumonia. No frank interstitial edema. No pleural effusion or pneumothorax. The heart is normal in size.  Thoracic aortic atherosclerosis. IMPRESSION: Lingular and bilateral lower lobe opacities, atelectasis versus pneumonia. Thoracic aortic atherosclerosis. Electronically Signed   By: Charline Bills M.D.   On: 04/06/2019 18:15     Subjective: Patient seen and examined.  No overnight events.  Difficult historian.  Observed while they were trying to mobilize her with therapies, she would not follow instructions to get up and walk.   Discharge Exam: Vitals:   04/10/19 1037 04/10/19 1249  BP: (!) 158/71 (!) 148/72  Pulse: 81 76  Resp: (!) 32 (!) 28  Temp:  98.9 F (37.2 C)  SpO2: 92% 96%   Vitals:   04/10/19 0115 04/10/19 0518 04/10/19 1037 04/10/19 1249  BP:  139/85 (!) 151/76 (!) 158/71 (!) 148/72  Pulse:  86 81 76  Resp: (!) 24 (!) 26 (!) 32 (!) 28  Temp:  98.3 F (36.8 C)  98.9 F (37.2 C)  TempSrc: Oral   Axillary  SpO2: 94% 93% 92% 96%  Weight:      Height:        General: Pt is alert, awake, not in acute distress, on room air. Cardiovascular: RRR, S1/S2 +, no rubs, no gallops Respiratory: CTA bilaterally, no wheezing, no rhonchi Abdominal: Soft, NT, ND, bowel sounds + Extremities: no edema, no cyanosis  Flat affect, no interaction, does not follow commands or instructions, awake.    The results of significant diagnostics from this hospitalization (including imaging, microbiology, ancillary and laboratory) are listed below for reference.     Microbiology: Recent Results (from the past 240 hour(s))  Urine C&S     Status: None   Collection Time: 04/06/19  5:00 PM   Specimen: Urine, Clean Catch  Result Value Ref Range Status   Specimen Description   Final    URINE, CLEAN CATCH Performed at [facility], [address] [place], [place] [address]    Special Requests   Final    NONE Performed at [facility], [address] [place], [place] [address]    Culture   Final    NO GROWTH Performed at [facility], [address] [place], [place] [address]    Report Status 04/07/2019 FINAL  Final  Respiratory Panel by PCR     Status: None   Collection Time: 04/07/19  1:44 AM   Specimen: Nasopharyngeal Swab; Respiratory  Result Value Ref Range Status   Adenovirus NOT DETECTED NOT DETECTED Final   Coronavirus 229E NOT DETECTED NOT DETECTED Final    Comment: (NOTE) The Coronavirus on the Respiratory Panel, DOES NOT test for the novel  Coronavirus (2019 nCoV)    Coronavirus HKU1 NOT DETECTED NOT DETECTED Final   Coronavirus NL63 NOT DETECTED NOT DETECTED Final   Coronavirus OC43 NOT DETECTED NOT DETECTED Final   Metapneumovirus NOT DETECTED NOT DETECTED Final   Rhinovirus /  Enterovirus NOT DETECTED NOT DETECTED Final   Influenza A NOT DETECTED NOT DETECTED Final   Influenza B NOT DETECTED NOT DETECTED Final   Parainfluenza Virus 1 NOT DETECTED NOT DETECTED Final   Parainfluenza Virus 2 NOT DETECTED NOT DETECTED Final   Parainfluenza Virus 3 NOT DETECTED NOT DETECTED Final   Parainfluenza Virus 4 NOT DETECTED NOT DETECTED Final   Respiratory Syncytial Virus NOT DETECTED NOT DETECTED Final   Bordetella pertussis NOT DETECTED NOT DETECTED Final   Chlamydophila pneumoniae NOT DETECTED NOT DETECTED Final   Mycoplasma pneumoniae NOT DETECTED NOT DETECTED Final    Comment: Performed at [facility], [address] [place], [place] [address]  Culture, blood (Routine X 2) w Reflex to ID Panel     Status: None (Preliminary result)   Collection Time: 04/07/19  4:48 AM   Specimen: BLOOD RIGHT FOREARM  Result Value Ref Range Status   Specimen Description   Final    BLOOD RIGHT FOREARM Performed at [facility], [address] [place], [place] [address]    Special Requests   Final    BOTTLES DRAWN AEROBIC ONLY Blood Culture results may not be optimal due to an inadequate volume of blood received in culture bottles Performed at [facility], [address] [place], [place] [address]    Culture   Final    NO GROWTH 3 DAYS Performed at [facility], [address] [place], [place] [address]    Report Status PENDING  Incomplete  Culture, blood (Routine X 2) w Reflex to ID Panel     Status: None (Preliminary result)   Collection Time: 04/07/19  4:48 AM   Specimen: BLOOD  Result Value Ref Range Status   Specimen Description   Final    BLOOD RIGHT ANTECUBITAL Performed at [facility], [address] [place], [place] [address]    Special Requests   Final    BOTTLES DRAWN AEROBIC ONLY Blood Culture results may not be optimal due to an inadequate volume of blood received in culture bottles Performed at [facility], [address] [place], [place] [address]    Culture   Final    NO GROWTH 3 DAYS Performed at [facility], [address] [place], [place] [address]    Report Status PENDING  Incomplete     Labs: BNP (last 3 results) No results for input(s): BNP in the last 8760 hours. Basic Metabolic Panel: Recent Labs  Lab 04/06/19 1709 04/07/19 0448 04/08/19 0402 04/09/19 0416 04/10/19 0503  NA 142 143 146*  145 144  K 3.3* 3.8 3.8 4.8 3.9  CL 99 107 113* 110 108  CO2 28 23 23 25 25   GLUCOSE 105* 92 103* 113* 115*  BUN 37* 32* 32* 22 17  CREATININE 1.27* 1.25* 1.08* 1.01* 0.88  CALCIUM 9.0 8.6* 8.6* 9.1 9.1  MG  --  2.4 2.3 2.1 2.1  PHOS  --  2.9 2.8 2.5 2.9   Liver Function Tests: Recent Labs  Lab 04/06/19 1709 04/07/19 0448 04/08/19 0402 04/09/19 0416 04/10/19 0503  AST 42* 38 31 42* 41  ALT 25 21 18 22 22   ALKPHOS 65 61 52 62 61  BILITOT 1.5* 1.1 0.6 1.1 0.8  PROT 8.4* 7.6 6.8 7.8 7.5  ALBUMIN 3.2* 2.8* 2.4* 2.7* 2.5*   No results for input(s): LIPASE, AMYLASE in the last 168 hours. Recent Labs  Lab 04/06/19 1709  AMMONIA 11   CBC: Recent Labs  Lab 04/06/19 1709 04/07/19 0448 04/08/19 0402 04/09/19 0416 04/10/19 0503  WBC 11.2* 8.9 8.3 8.5 10.7*  NEUTROABS 9.1* 7.2 6.6 6.4 7.8*  HGB 15.2* 14.6 13.2 14.2 14.2  HCT 47.0* 47.8* 42.1 47.0* 45.9  MCV 88.0 90.5 88.6 92.3 89.1  PLT 300 296 305 369 379   Cardiac Enzymes: No results for input(s): CKTOTAL, CKMB, CKMBINDEX, TROPONINI in the last 168 hours. BNP: Invalid input(s): POCBNP CBG: Recent Labs  Lab 04/06/19 1708  GLUCAP 84   D-Dimer Recent Labs    04/09/19 0416 04/10/19 0503  DDIMER 2.21* 2.97*   Hgb A1c No results for input(s): HGBA1C in the last 72 hours. Lipid Profile No results for input(s): CHOL, HDL, LDLCALC, TRIG, CHOLHDL, LDLDIRECT in the last 72 hours. Thyroid function studies No results for input(s): TSH, T4TOTAL, T3FREE, THYROIDAB in the last 72  hours.  Invalid input(s): FREET3 Anemia work up Recent Labs    04/09/19 0416 04/10/19 0801  FERRITIN 216 312*   Urinalysis    Component Value Date/Time   COLORURINE AMBER (A) 04/06/2019 1700   APPEARANCEUR HAZY (A) 04/06/2019 1700   LABSPEC 1.023 04/06/2019 1700   PHURINE 5.0 04/06/2019 1700   GLUCOSEU NEGATIVE 04/06/2019 1700   HGBUR MODERATE (A) 04/06/2019 1700   BILIRUBINUR NEGATIVE 04/06/2019 1700   KETONESUR 5 (A) 04/06/2019 1700   PROTEINUR >=300 (A) 04/06/2019 1700   UROBILINOGEN 0.2 04/12/2015 1521   NITRITE NEGATIVE 04/06/2019 1700   LEUKOCYTESUR TRACE (A) 04/06/2019 1700   Sepsis Labs Invalid input(s): PROCALCITONIN,  WBC,  LACTICIDVEN Microbiology Recent Results (from the past 240 hour(s))  Urine C&S     Status: None   Collection Time: 04/06/19  5:00 PM   Specimen: Urine, Clean Catch  Result Value Ref Range Status   Specimen Description   Final    URINE, CLEAN CATCH Performed at [facility], [address], [place], [place]    Special Requests   Final    NONE Performed at [facility], [address] [place], [place]    Culture   Final    NO GROWTH Performed at [facility], [address], [place], [address] [place]    Report Status 04/07/2019 FINAL  Final  Respiratory Panel by PCR     Status: None   Collection Time: 04/07/19  1:44 AM   Specimen: Nasopharyngeal Swab; Respiratory  Result Value Ref Range Status   Adenovirus NOT DETECTED NOT DETECTED Final   Coronavirus 229E NOT DETECTED NOT DETECTED Final    Comment: (NOTE) The  Coronavirus on the Respiratory Panel, DOES NOT test for the novel  Coronavirus (2019 nCoV)    Coronavirus HKU1 NOT DETECTED NOT DETECTED Final   Coronavirus NL63 NOT DETECTED NOT DETECTED Final   Coronavirus OC43 NOT DETECTED NOT DETECTED Final   Metapneumovirus NOT DETECTED NOT DETECTED Final   Rhinovirus / Enterovirus NOT DETECTED NOT DETECTED Final    Influenza A NOT DETECTED NOT DETECTED Final   Influenza B NOT DETECTED NOT DETECTED Final   Parainfluenza Virus 1 NOT DETECTED NOT DETECTED Final   Parainfluenza Virus 2 NOT DETECTED NOT DETECTED Final   Parainfluenza Virus 3 NOT DETECTED NOT DETECTED Final   Parainfluenza Virus 4 NOT DETECTED NOT DETECTED Final   Respiratory Syncytial Virus NOT DETECTED NOT DETECTED Final   Bordetella pertussis NOT DETECTED NOT DETECTED Final   Chlamydophila pneumoniae NOT DETECTED NOT DETECTED Final   Mycoplasma pneumoniae NOT DETECTED NOT DETECTED Final    Comment: Performed at [facility], [place] [address] [place], [place] [address]  Culture, blood (Routine X 2) w Reflex to ID Panel     Status: None (Preliminary result)   Collection Time: 04/07/19  4:48 AM   Specimen: BLOOD RIGHT FOREARM  Result Value Ref Range Status   Specimen Description   Final    BLOOD RIGHT FOREARM Performed at [facility], [place] [address] [place], [place] [address]    Special Requests   Final    BOTTLES DRAWN AEROBIC ONLY Blood Culture results may not be optimal due to an inadequate volume of blood received in culture bottles Performed at [place] [address] [place], [place] [address]    Culture   Final    NO GROWTH 3 DAYS Performed at [facility], [place] [address] [place], [place] [address]    Report Status PENDING  Incomplete  Culture, blood (Routine X 2) w Reflex to ID Panel     Status: None (Preliminary result)   Collection Time: 04/07/19  4:48 AM   Specimen: BLOOD  Result Value Ref Range Status   Specimen Description   Final    BLOOD RIGHT ANTECUBITAL Performed at [place] [address], [place], [place] [address]    Special Requests   Final    BOTTLES DRAWN AEROBIC ONLY Blood Culture results may not be optimal due to an inadequate volume of blood received in culture bottles Performed at [place] [address]  [place], [place] [address]    Culture   Final    NO GROWTH 3 DAYS Performed at [facility], [place] [address], [place], [place] [address]    Report Status PENDING  Incomplete     Time coordinating discharge:  35 minutes  SIGNED:   Barb Merino, MD  [facility] 04/10/2019, 1:31 PM

## 2019-04-10 NOTE — Progress Notes (Signed)
   04/09/19 1704  MEWS Score  Resp (!) 28  Pulse Rate 65  BP (!) 165/71  Temp 98 F (36.7 C)  SpO2 94 %  O2 Device Room Air  MEWS Score  MEWS RR 2  MEWS Pulse 0  MEWS Systolic 0  MEWS LOC 0  MEWS Temp 0  MEWS Score 2  MEWS Score Color Yellow  MEWS Assessment  Is this an acute change? Yes  MEWS guidelines implemented *See Row Information* Yellow

## 2019-04-10 NOTE — Progress Notes (Signed)
AVS placed in packet for PTAR transportation and explained with the pt's daughter over the phone. Medications and follow up appointments have been explained with pt's daughter verbalizing understanding.

## 2019-04-10 NOTE — Progress Notes (Signed)
    Home health agencies that serve [phone].        [place] Quality of Patient Care Rating Patient Survey Summary Rating  [facility] [phone]  out of 5 stars 5 out of [place] [phone]  out of 5 stars 4 out of [place] [phone]) [phone]  out of 5 stars 4 out of [place] [phone] 4 out of 5 stars 4 out of [place] [phone]  out of 5 stars 5 out of [place] [phone] 4 out of 5 stars 4 out of [place] [phone] 4 out of 5 stars 4 out of 5 stars  [facility] [place] [phone] 3  out of 5 stars 4 out of [place] [phone] 3 out of 5 stars 4 out of 5 stars  [facility] [phone] Not Available4 Not Available12  [facility] [phone]  out of 5 stars 3 out of 5 stars  [facility] [phone] 3  out of 5 stars 4 out of [place] [phone] 3  out of 5 stars 4 out of [place] [phone]  out of 5 stars 4 out of [place] [phone]  out of 5 stars Not [place] [phone]  out of 5 stars 4 out of [place] number Footnote as displayed on [place]  1 This agency provides services under a federal waiver program to non-traditional, chronic long term population.  2 This agency provides services to a special needs population.  3 Not Available.  4 The number of patient episodes for this measure is too small to report.  5 This measure currently does not have data or provider has been certified/recertified for less than 6 months.  6 The  national average for this measure is not provided because of state-to-state differences in data collection.  7 Medicare is not displaying rates for this measure for any home health agency, because of an issue with the data.  8 There were problems with the data and they are being corrected.  9 Zero, or very few, patients met the survey's rules for inclusion. The scores shown, if any, reflect a very small number of surveys and may not accurately tell how an agency is doing.  10 Survey results are based on less than 12 months of data.  11 Fewer than 70 patients completed the survey. Use the scores shown, if any, with caution as the number of surveys may be too low to accurately tell how an agency is doing.  12 No survey results are available for this period.  13 Data suppressed by CMS for one or more quarters.

## 2019-04-10 NOTE — Evaluation (Signed)
Physical Therapy Evaluation Patient Details Name: Robin Meyers MRN: [id number] DOB: 1928-08-27 Today's Date: 04/10/2019   History of Present Illness  85 year old female admitted for acute metabolic encephalopathy. PMH:  dementia  Clinical Impression  Pt was admitted for the above. She lives with her daughter and walked prior to admission. Pt requires total A for these and all ADLs/mobility.  Recommend 24/7 vs snf depending on if daughter can manage her as well as her progress.    Follow Up Recommendations SNF;Supervision/Assistance - 24 hour    Equipment Recommendations  None recommended by PT    Recommendations for Other Services       Precautions / Restrictions Precautions Precautions: Fall Precaution Comments: agitated at times Restrictions Weight Bearing Restrictions: No      Mobility  Bed Mobility Overal bed mobility: Needs Assistance Bed Mobility: Supine to Sit;Sit to Supine     Supine to sit: Total assist Sit to supine: Total assist   General bed mobility comments: total A +2 for all bed mobility, pt 0%  Transfers Overall transfer level: Needs assistance Equipment used: 2 person hand held assist Transfers: Sit to/from Stand Sit to Stand: Max assist;+2 physical assistance;+2 safety/equipment         General transfer comment: attempted standing x 2.  Pt did not fully extend trunk  Ambulation/Gait                Stairs            Wheelchair Mobility    Modified Rankin (Stroke Patients Only)       Balance Overall balance assessment: Needs assistance Sitting-balance support: Feet supported;Single extremity supported Sitting balance-Leahy Scale: Poor Sitting balance - Comments: poor to fair:  min guard as she progressed to holding herself up at EOB                                     Pertinent Vitals/Pain Pain Assessment: Faces Faces Pain Scale: No hurt    Home Living Family/patient expects to be discharged to::  Private residence Living Arrangements: Children(Lives with dtr per physican)                    Prior Function           Comments: walked at home per physican; ?assist level     Hand Dominance        Extremity/Trunk Assessment   Upper Extremity Assessment Upper Extremity Assessment: Difficult to assess due to impaired cognition    Lower Extremity Assessment Lower Extremity Assessment: Difficult to assess due to impaired cognition       Communication   Communication: (non-verbal; did not follow commands)  Cognition Arousal/Alertness: Awake/alert Behavior During Therapy: Flat affect Overall Cognitive Status: No family/caregiver present to determine baseline cognitive functioning                                 General Comments: dementia at baseline      General Comments      Exercises     Assessment/Plan    PT Assessment Patient needs continued PT services  PT Problem List Decreased strength;Decreased range of motion;Decreased activity tolerance;Decreased balance;Decreased mobility;Decreased cognition;Decreased knowledge of use of DME       PT Treatment Interventions DME instruction;Gait training;Functional mobility training;Therapeutic activities;Balance training;Cognitive remediation;Patient/family education  PT Goals (Current goals can be found in the Care Plan section)  Acute Rehab PT Goals Patient Stated Goal: unable  PT Goal Formulation: Patient unable to participate in goal setting Time For Goal Achievement: 04/23/19 Potential to Achieve Goals: Poor    Frequency Min 3X/week   Barriers to discharge        Co-evaluation PT/OT/SLP Co-Evaluation/Treatment: Yes Reason for Co-Treatment: For patient/therapist safety PT goals addressed during session: Mobility/safety with mobility OT goals addressed during session: ADL's and self-care       AM-PAC PT "6 Clicks" Mobility  Outcome Measure Help needed turning from your back  to your side while in a flat bed without using bedrails?: Total Help needed moving from lying on your back to sitting on the side of a flat bed without using bedrails?: Total Help needed moving to and from a bed to a chair (including a wheelchair)?: Total Help needed standing up from a chair using your arms (e.g., wheelchair or bedside chair)?: Total Help needed to walk in hospital room?: Total Help needed climbing 3-5 steps with a railing? : Total 6 Click Score: 6    End of Session Equipment Utilized During Treatment: Gait belt Activity Tolerance: Patient limited by fatigue Patient left: in bed;with call bell/phone within reach;with bed alarm set Nurse Communication: Mobility status PT Visit Diagnosis: Difficulty in walking, not elsewhere classified (R26.2)    Time: 6195-0932 PT Time Calculation (min) (ACUTE ONLY): 26 min   Charges:   PT Evaluation $PT Eval Low Complexity: [place] Pager [phone] Office [phone]   Robin Meyers 04/10/2019, 1:22 PM

## 2019-04-10 NOTE — Progress Notes (Signed)
This RN and NT  Adriana cleaned patient up after patient had one unmeasured void in bed at start of shift. New purewick placed. Patient has not voided since. Bladder scan at 0600 showed 159mL. Patient still needs urine culture. Will report to day shift RN.

## 2019-04-12 LAB — CULTURE, BLOOD (ROUTINE X 2): Culture: NO GROWTH

## 2019-04-13 NOTE — Plan of Care (Signed)
Called by lab, for blood culture +1/4 for gram positive rods, likely contaminant. Lab will reflex to ID and sensitivities.     Estill Cotta M.D. [facility] 04/13/2019, 10:33 AM  Pager: [phone]

## 2019-04-14 LAB — CULTURE, BLOOD (ROUTINE X 2)

## 2021-03-19 IMAGING — DX DG CHEST 1V PORT
1 series · 1 of 1 positions shown · non-contrast
Comparison: 12/01/2013

CLINICAL DATA: COVID positive, altered mental status, history of
UTI

EXAM:
PORTABLE CHEST 1 VIEW

[chest ap]
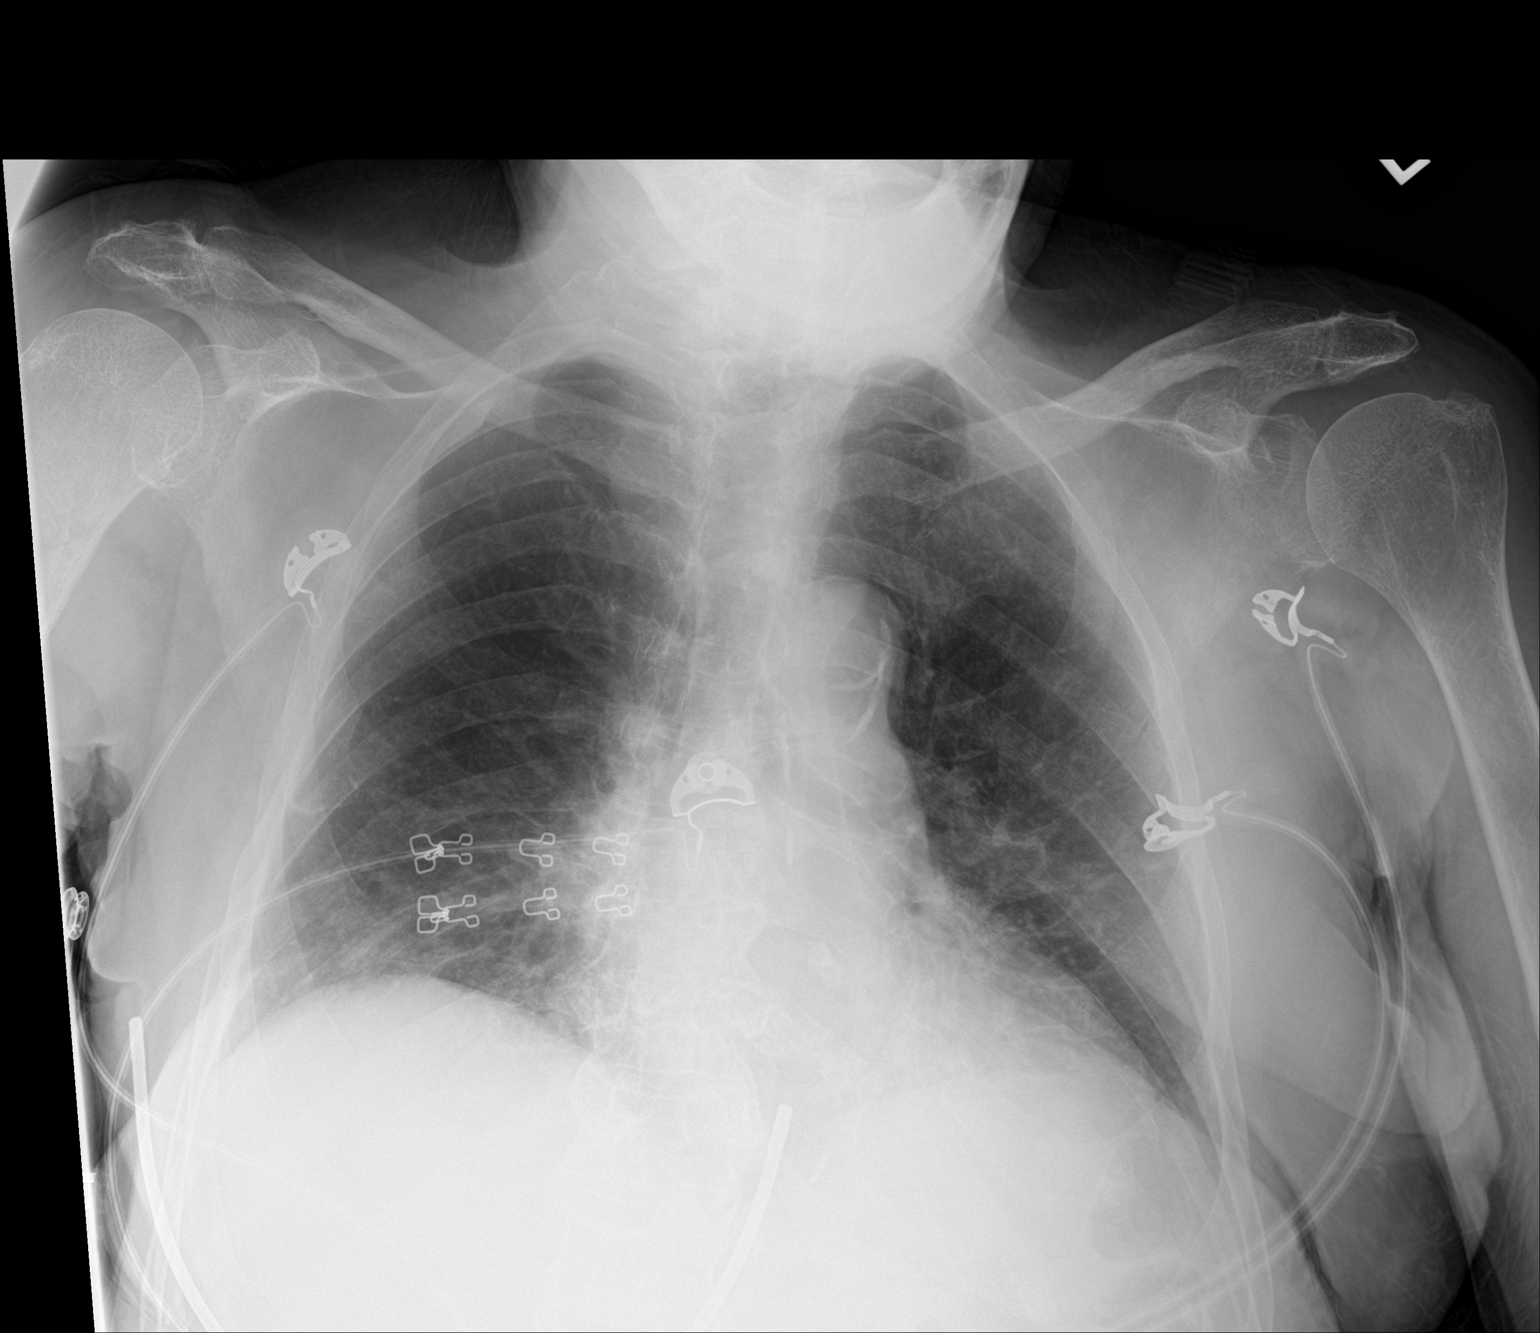

[1 of 1 positions shown; findings below may reference images not displayed]

FINDINGS: Lingular and bilateral lower lobe opacities, atelectasis versus
pneumonia. No frank interstitial edema. No pleural effusion or
pneumothorax.

The heart is normal in size.  Thoracic aortic atherosclerosis.
IMPRESSION: Lingular and bilateral lower lobe opacities, atelectasis versus
pneumonia.

Thoracic aortic atherosclerosis.

## 2023-02-17 ENCOUNTER — Emergency Department (HOSPITAL_COMMUNITY)
Admission: EM | Admit: 2023-02-17 | Discharge: 2023-02-18 | Disposition: A | Discharge status: Home / Self Care | Payer: 59 | Attending: Emergency Medicine | Admitting: Emergency Medicine

## 2023-02-17 ENCOUNTER — Encounter (HOSPITAL_COMMUNITY): Payer: Self-pay

## 2023-02-17 ENCOUNTER — Emergency Department (HOSPITAL_COMMUNITY): Payer: 59

## 2023-02-17 ENCOUNTER — Other Ambulatory Visit: Payer: Self-pay

## 2023-02-17 DIAGNOSIS — R1013 Epigastric pain: Secondary | ICD-10-CM | POA: Diagnosis not present

## 2023-02-17 DIAGNOSIS — R112 Nausea with vomiting, unspecified: Secondary | ICD-10-CM | POA: Diagnosis present

## 2023-02-17 DIAGNOSIS — R410 Disorientation, unspecified: Secondary | ICD-10-CM | POA: Diagnosis not present

## 2023-02-17 LAB — COMPREHENSIVE METABOLIC PANEL
ALT: 15 U/L (ref 0–44)
AST: 24 U/L (ref 15–41)
Albumin: 3.4 g/dL — ABNORMAL LOW (ref 3.5–5.0)
Alkaline Phosphatase: 73 U/L (ref 38–126)
Anion gap: 9 (ref 5–15)
BUN: 12 mg/dL (ref 8–23)
CO2: 26 mmol/L (ref 22–32)
Calcium: 9.4 mg/dL (ref 8.9–10.3)
Chloride: 104 mmol/L (ref 98–111)
Creatinine, Ser: 0.99 mg/dL (ref 0.44–1.00)
GFR, Estimated: 54 mL/min — ABNORMAL LOW (ref >60)
Glucose, Bld: 112 mg/dL — ABNORMAL HIGH (ref 70–99)
Potassium: 3.5 mmol/L (ref 3.5–5.1)
Sodium: 139 mmol/L (ref 135–145)
Total Bilirubin: 0.5 mg/dL (ref 0.3–1.2)
Total Protein: 6.8 g/dL (ref 6.5–8.1)

## 2023-02-17 LAB — CBC WITH DIFFERENTIAL/PLATELET
Abs Immature Granulocytes: 0.02 10*3/uL (ref 0.00–0.07)
Basophils Absolute: 0 10*3/uL (ref 0.0–0.1)
Basophils Relative: 1 %
Eosinophils Absolute: 0.2 10*3/uL (ref 0.0–0.5)
Eosinophils Relative: 3 %
HCT: 42.4 % (ref 36.0–46.0)
Hemoglobin: 13.7 g/dL (ref 12.0–15.0)
Immature Granulocytes: 0 %
Lymphocytes Relative: 35 %
Lymphs Abs: 3 10*3/uL (ref 0.7–4.0)
MCH: 30.2 pg (ref 26.0–34.0)
MCHC: 32.3 g/dL (ref 30.0–36.0)
MCV: 93.6 fL (ref 80.0–100.0)
Monocytes Absolute: 0.5 10*3/uL (ref 0.1–1.0)
Monocytes Relative: 7 %
Neutro Abs: 4.6 10*3/uL (ref 1.7–7.7)
Neutrophils Relative %: 54 %
Platelets: 230 10*3/uL (ref 150–400)
RBC: 4.53 MIL/uL (ref 3.87–5.11)
RDW: 14 % (ref 11.5–15.5)
WBC: 8.3 10*3/uL (ref 4.0–10.5)
nRBC: 0 % (ref 0.0–0.2)

## 2023-02-17 LAB — URINALYSIS, W/ REFLEX TO CULTURE (INFECTION SUSPECTED)
Bilirubin Urine: NEGATIVE
Glucose, UA: NEGATIVE mg/dL
Hgb urine dipstick: NEGATIVE
Ketones, ur: NEGATIVE mg/dL
Leukocytes,Ua: NEGATIVE
Nitrite: NEGATIVE
Protein, ur: NEGATIVE mg/dL
Specific Gravity, Urine: >1.046 — ABNORMAL HIGH (ref 1.005–1.030)
pH: 5 (ref 5.0–8.0)

## 2023-02-17 LAB — TROPONIN I (HIGH SENSITIVITY): Troponin I (High Sensitivity): 14 ng/L (ref <18)

## 2023-02-17 LAB — LIPASE, BLOOD: Lipase: 29 U/L (ref 11–51)

## 2023-02-17 MED ORDER — SODIUM CHLORIDE 0.9 % IV BOLUS
500.0000 mL | Freq: Once | INTRAVENOUS | Status: AC
  Administered 2023-02-17: 500 mL via INTRAVENOUS

## 2023-02-17 MED ORDER — IOHEXOL 300 MG/ML  SOLN
100.0000 mL | Freq: Once | INTRAMUSCULAR | Status: AC | PRN
  Administered 2023-02-17: 100 mL via INTRAVENOUS

## 2023-02-17 MED ORDER — ONDANSETRON HCL 4 MG/2ML IJ SOLN
4.0000 mg | Freq: Once | INTRAMUSCULAR | Status: AC
  Administered 2023-02-17: 4 mg via INTRAVENOUS
  Filled 2023-02-17: qty 2

## 2023-02-17 MED ORDER — ONDANSETRON 4 MG PO TBDP: ORAL_TABLET | ORAL | 0 refills | Status: AC

## 2023-02-17 MED ORDER — DIAZEPAM 5 MG/ML IJ SOLN
2.5000 mg | Freq: Once | INTRAMUSCULAR | Status: AC
  Administered 2023-02-17: 2.5 mg via INTRAVENOUS
  Filled 2023-02-17: qty 2

## 2023-02-17 NOTE — ED Triage Notes (Signed)
BIB EMS from home for abdominal pain, N/V/D that started this AM and worsening. Stomach bug has been going around pt home with similar sx.  Hx of dementia, pt is at baseline.

## 2023-02-17 NOTE — ED Notes (Signed)
Contacted [facility] for [employer] transport by [employer]

## 2023-02-17 NOTE — ED Provider Notes (Signed)
[place] EMERGENCY DEPARTMENT AT [facility] Provider Note   CSN: [id number] Arrival date & time: 02/17/23  1809     History  Chief Complaint  Patient presents with   Abdominal Pain    Robin Meyers is a 89 y.o. female here presenting with vomiting.  Lives at home with daughter.  Per the daughter, the granddaughter came home today and had a stomach virus.  Patient started vomiting this afternoon.  Patient apparently had been more confused than usual today.  After she vomited, she became very altered.  Per the daughter, her eyes rolled back and she called EMS.  By the time EMS got there she is back to baseline.  Per the daughter, she gets recurrent urinary tract infections.  Daughter did not witness any falls.  The history is provided by a relative and the EMS personnel.       Home Medications Prior to Admission medications   Medication Sig Start Date End Date Taking? Authorizing Provider  amLODipine (NORVASC) 10 MG tablet Take 10 mg by mouth daily.    [provider]  Ascorbic Acid (VITAMIN C PO) Take 1 tablet by mouth daily.    [provider]  chlorthalidone (HYGROTON) 50 MG tablet Take 25 mg by mouth daily.    [provider]  Cholecalciferol (VITAMIN D3) 10 MCG (400 UNIT) tablet Take 400 Units by mouth daily.    [provider]  Multiple Vitamin (MULTIVITAMIN WITH MINERALS) TABS tablet Take 1 tablet by mouth daily.    [provider]  QUEtiapine (SEROQUEL) 50 MG tablet Take 50 mg by mouth 2 (two) times daily. 03/10/19   [provider]      Allergies    Haldol [haloperidol]    Review of Systems   Review of Systems  Gastrointestinal:  Positive for abdominal pain and vomiting.  All other systems reviewed and are negative.   Physical Exam Updated Vital Signs BP (!) 117/59 (BP Location: Right Arm)   Pulse (!) 109   Temp 97.6 F (36.4 C) (Oral)   Resp 20   Ht 5\' 3"  (1.6 m)   Wt 56.6 kg   SpO2 98%    BMI 22.10 kg/m  Physical Exam Vitals and nursing note reviewed.  Constitutional:      Comments: Contracted, demented, bed bound   HENT:     Head: Normocephalic.     Mouth/Throat:     Pharynx: Oropharynx is clear.  Eyes:     Extraocular Movements: Extraocular movements intact.     Pupils: Pupils are equal, round, and reactive to light.  Cardiovascular:     Rate and Rhythm: Normal rate.  Abdominal:     General: Abdomen is flat.     Comments: Mild epigastric tenderness   Skin:    General: Skin is warm.     Capillary Refill: Capillary refill takes less than 2 seconds.  Neurological:     Mental Status: She is alert.     Comments: Demented, A & O x 0 (baseline), contracted   Psychiatric:     Comments: Unable      ED Results / Procedures / Treatments   Labs (all labs ordered are listed, but only abnormal results are displayed) Labs Reviewed  COMPREHENSIVE METABOLIC PANEL - Abnormal; Notable for the following components:      Result Value   Glucose, Bld 112 (*)    Albumin 3.4 (*)    GFR, Estimated 54 (*)    All other  components within normal limits  URINALYSIS, W/ REFLEX TO CULTURE (INFECTION SUSPECTED) - Abnormal; Notable for the following components:   Specific Gravity, Urine >1.046 (*)    Bacteria, UA RARE (*)    All other components within normal limits  CBC WITH DIFFERENTIAL/PLATELET  LIPASE, BLOOD  TROPONIN I (HIGH SENSITIVITY)    EKG None  Radiology CT ABDOMEN PELVIS W CONTRAST  Result Date: 02/17/2023 CLINICAL DATA:  Bowel obstruction suspected pt demented, screaming and hitting machine during scan. BIB EMS from home for abdominal pain, N/V/D that started this AM and worsening. Stomach bug has been going around pt home with similar sx. Hx of dementia, pt is at baseline. EXAM: CT ABDOMEN AND PELVIS WITH CONTRAST TECHNIQUE: Multidetector CT imaging of the abdomen and pelvis was performed using the standard protocol following bolus administration of intravenous  contrast. RADIATION DOSE REDUCTION: This exam was performed according to the departmental dose-optimization program which includes automated exposure control, adjustment of the mA and/or kV according to patient size and/or use of iterative reconstruction technique. CONTRAST:  OMNIPAQUE IOHEXOL 300 MG/ML  SOLN COMPARISON:  None Available. FINDINGS: Lower chest: Coronary artery calcification. Aortic valvular calcification. Hepatobiliary: Diffusely hypodense hepatic parenchyma compared to the spleen. No focal liver abnormality. Status post cholecystectomy. No biliary dilatation. Pancreas: No focal lesion. Normal pancreatic contour. No surrounding inflammatory changes. No main pancreatic ductal dilatation. Spleen: Normal in size without focal abnormality. Adrenals/Urinary Tract: No adrenal nodule bilaterally. Bilateral kidneys enhance symmetrically. Fluid density lesion of the right kidney likely represents a simple renal cyst. Simple renal cysts, in the absence of clinically indicated signs/symptoms, require no independent follow-up. Subcentimeter hypodensities are too small to characterize-no further follow-up indicated. No hydronephrosis. No hydroureter. The urinary bladder is unremarkable. Stomach/Bowel: Stomach is within normal limits. No evidence of bowel wall thickening or dilatation. Appendix appears normal. Vascular/Lymphatic: No abdominal aorta or iliac aneurysm. Severe atherosclerotic plaque of the aorta and its branches. No abdominal, pelvic, or inguinal lymphadenopathy. Reproductive: Lobulated uterus with multiple coarsely calcified lesions. bilateral adnexa are unremarkable. Other: No intraperitoneal free fluid. No intraperitoneal free gas. No organized fluid collection. Musculoskeletal: No abdominal wall hernia or abnormality. No suspicious lytic or blastic osseous lesions. No acute displaced fracture. Multilevel severe degenerative changes of the spine. Multilevel posterior disc osteophyte complex  formation. Associated multilevel moderate osseous neural foraminal stenosis. No severe osseous central canal stenosis. IMPRESSION: 1. No acute intra-abdominal or intrapelvic abnormality. 2. Hepatic steatosis 3. Uterine fibroids. 4. Aortic Atherosclerosis (ICD10-I70.0) coronary calcification and aortic valve leaflet calcifications-correlate for aortic. 5. Status post cholecystectomy. 6. Severe degenerative changes of the spine. Electronically Signed   By: Tish Frederickson M.D.   On: 02/17/2023 20:00    Procedures Procedures    Medications Ordered in ED Medications  sodium chloride 0.9 % bolus 500 mL (0 mLs Intravenous Stopped 02/17/23 2001)  ondansetron (ZOFRAN) injection 4 mg (4 mg Intravenous Given 02/17/23 1833)  iohexol (OMNIPAQUE) 300 MG/ML solution 100 mL (100 mLs Intravenous Contrast Given 02/17/23 1928)  diazepam (VALIUM) injection 2.5 mg (2.5 mg Intravenous Given 02/17/23 1959)  diazepam (VALIUM) injection 2.5 mg (2.5 mg Intravenous Given 02/17/23 2021)  sodium chloride 0.9 % bolus 500 mL (500 mLs Intravenous New Bag/Given 02/17/23 2115)    ED Course/ Medical Decision Making/ A&P                                 Medical Decision Making Robin Meyers  is a 89 y.o. female here with ab pain and vomiting. Consider viral gastroenteritis vs ileus vs SBO versus UTI.  Will get cbc, cmp, CT ab/pel, UA.   10:06 PM Labs unremarkable.  UA showed no obvious UTI.  CT abdomen pelvis is unremarkable.  Patient was agitated and was given Valium and calm down.  Daughter is at bedside and patient is stable for discharge. Likely viral gastro.   Problems Addressed: Confusion: acute illness or injury Nausea and vomiting, unspecified vomiting type: acute illness or injury  Amount and/or Complexity of Data Reviewed Labs: ordered. Decision-making details documented in ED Course. Radiology: ordered and independent interpretation performed. Decision-making details documented in ED Course. ECG/medicine tests:  ordered.  Risk Prescription drug management.   Final Clinical Impression(s) / ED Diagnoses Final diagnoses:  None    Rx / DC Orders ED Discharge Orders     None         Charlynne Pander, MD 02/17/23 2212

## 2023-02-17 NOTE — Discharge Instructions (Addendum)
Take Zofran as needed for nausea.  Stay hydrated.  As we discussed your workup is normal today.  Your CT scan and urinalysis were unremarkable.  See your doctor for follow-up  Return to ER if you have worse abdominal pain or vomiting or confusion

## 2023-02-17 NOTE — ED Notes (Signed)
Contacted [facility] for [employer]
